# Patient Record
Sex: Male | Born: 1945 | Race: White | Hispanic: No | Marital: Married | Smoking: Former smoker
Health system: Southern US, Community
[De-identification: ages and names within clinical notes are randomized; demographics above are authoritative.]

## PROBLEM LIST (undated history)

## (undated) DIAGNOSIS — E785 Hyperlipidemia, unspecified: Secondary | ICD-10-CM

## (undated) DIAGNOSIS — I2510 Atherosclerotic heart disease of native coronary artery without angina pectoris: Secondary | ICD-10-CM

## (undated) DIAGNOSIS — I219 Acute myocardial infarction, unspecified: Secondary | ICD-10-CM

## (undated) DIAGNOSIS — M1990 Unspecified osteoarthritis, unspecified site: Secondary | ICD-10-CM

## (undated) DIAGNOSIS — R001 Bradycardia, unspecified: Secondary | ICD-10-CM

## (undated) DIAGNOSIS — C4491 Basal cell carcinoma of skin, unspecified: Secondary | ICD-10-CM

## (undated) DIAGNOSIS — I519 Heart disease, unspecified: Secondary | ICD-10-CM

## (undated) DIAGNOSIS — Z9889 Other specified postprocedural states: Secondary | ICD-10-CM

## (undated) DIAGNOSIS — L821 Other seborrheic keratosis: Secondary | ICD-10-CM

## (undated) DIAGNOSIS — R112 Nausea with vomiting, unspecified: Secondary | ICD-10-CM

## (undated) HISTORY — PX: DOPPLER ECHOCARDIOGRAPHY: SHX263

## (undated) HISTORY — DX: Atherosclerotic heart disease of native coronary artery without angina pectoris: I25.10

## (undated) HISTORY — PX: NM MYOVIEW LTD: HXRAD82

## (undated) HISTORY — DX: Bradycardia, unspecified: R00.1

## (undated) HISTORY — PX: KNEE ARTHROPLASTY: SHX992

## (undated) HISTORY — DX: Basal cell carcinoma of skin, unspecified: C44.91

## (undated) HISTORY — DX: Hyperlipidemia, unspecified: E78.5

## (undated) HISTORY — DX: Other seborrheic keratosis: L82.1

## (undated) HISTORY — DX: Heart disease, unspecified: I51.9

---

## 1991-03-10 HISTORY — PX: OTHER SURGICAL HISTORY: SHX169

## 1992-03-09 HISTORY — PX: GALLBLADDER SURGERY: SHX652

## 1998-05-23 ENCOUNTER — Ambulatory Visit (HOSPITAL_COMMUNITY): Admission: RE | Admit: 1998-05-23 | Discharge: 1998-05-23 | Payer: Self-pay | Admitting: Gastroenterology

## 2005-03-09 DIAGNOSIS — I219 Acute myocardial infarction, unspecified: Secondary | ICD-10-CM

## 2005-03-09 HISTORY — DX: Acute myocardial infarction, unspecified: I21.9

## 2005-06-11 ENCOUNTER — Inpatient Hospital Stay (HOSPITAL_COMMUNITY): Admission: EM | Admit: 2005-06-11 | Discharge: 2005-06-13 | Payer: Self-pay | Admitting: Emergency Medicine

## 2005-07-02 ENCOUNTER — Encounter (HOSPITAL_COMMUNITY): Admission: RE | Admit: 2005-07-02 | Discharge: 2005-09-30 | Payer: Self-pay | Admitting: Cardiology

## 2005-10-01 ENCOUNTER — Encounter (HOSPITAL_COMMUNITY): Admission: RE | Admit: 2005-10-01 | Discharge: 2005-11-17 | Payer: Self-pay | Admitting: Cardiology

## 2012-04-08 ENCOUNTER — Other Ambulatory Visit (HOSPITAL_COMMUNITY): Payer: Self-pay | Admitting: Cardiovascular Disease

## 2012-04-08 DIAGNOSIS — I729 Aneurysm of unspecified site: Secondary | ICD-10-CM

## 2012-04-08 DIAGNOSIS — I350 Nonrheumatic aortic (valve) stenosis: Secondary | ICD-10-CM

## 2012-04-29 ENCOUNTER — Ambulatory Visit (HOSPITAL_COMMUNITY)
Admission: RE | Admit: 2012-04-29 | Discharge: 2012-04-29 | Disposition: A | Discharge status: Home / Self Care | Payer: Medicare Other | Source: Ambulatory Visit | Attending: Cardiovascular Disease | Admitting: Cardiovascular Disease

## 2012-04-29 ENCOUNTER — Ambulatory Visit (HOSPITAL_COMMUNITY): Payer: Self-pay

## 2012-04-29 DIAGNOSIS — I729 Aneurysm of unspecified site: Secondary | ICD-10-CM

## 2012-04-29 DIAGNOSIS — I350 Nonrheumatic aortic (valve) stenosis: Secondary | ICD-10-CM

## 2012-04-29 DIAGNOSIS — I359 Nonrheumatic aortic valve disorder, unspecified: Secondary | ICD-10-CM | POA: Insufficient documentation

## 2012-04-29 NOTE — Progress Notes (Signed)
Aorta Duplex Completed. Rashelle Ireland D  

## 2012-04-29 NOTE — Progress Notes (Signed)
[facility]   2D echo completed 04/29/2012.   Cindy Ricketta Colantonio, RDCS  

## 2012-10-04 ENCOUNTER — Telehealth: Payer: Self-pay | Admitting: Cardiovascular Disease

## 2012-10-04 NOTE — Telephone Encounter (Signed)
Please mail patient a lab slip so her can have his labs drawn prior to his visit on 10/24/12 with Dr. Alanda Amass.

## 2012-10-04 NOTE — Telephone Encounter (Signed)
Returned call.  Pt informed lab(s) ordered and Lab slip mailed.  Pt verbalized understanding and agreed w/ plan.

## 2012-10-18 ENCOUNTER — Other Ambulatory Visit: Payer: Self-pay | Admitting: Cardiovascular Disease

## 2012-10-18 LAB — COMPREHENSIVE METABOLIC PANEL
ALT: 12 U/L (ref 0–53)
AST: 23 U/L (ref 0–37)
Albumin: 4 g/dL (ref 3.5–5.2)
Alkaline Phosphatase: 62 U/L (ref 39–117)
BUN: 18 mg/dL (ref 6–23)
CO2: 28 mEq/L (ref 19–32)
Calcium: 8.9 mg/dL (ref 8.4–10.5)
Chloride: 106 mEq/L (ref 96–112)
Creat: 1.28 mg/dL (ref 0.50–1.35)
Glucose, Bld: 106 mg/dL — ABNORMAL HIGH (ref 70–99)
Potassium: 4.7 mEq/L (ref 3.5–5.3)
Sodium: 142 mEq/L (ref 135–145)
Total Bilirubin: 0.6 mg/dL (ref 0.3–1.2)
Total Protein: 6.4 g/dL (ref 6.0–8.3)

## 2012-10-18 LAB — CBC
HCT: 44.2 % (ref 39.0–52.0)
Hemoglobin: 15.4 g/dL (ref 13.0–17.0)
MCH: 31.5 pg (ref 26.0–34.0)
MCHC: 34.8 g/dL (ref 30.0–36.0)
MCV: 90.4 fL (ref 78.0–100.0)
Platelets: 235 10*3/uL (ref 150–400)
RBC: 4.89 MIL/uL (ref 4.22–5.81)
RDW: 13.8 % (ref 11.5–15.5)
WBC: 7.4 10*3/uL (ref 4.0–10.5)

## 2012-10-18 LAB — LIPID PANEL
Cholesterol: 130 mg/dL (ref 0–200)
HDL: 37 mg/dL — ABNORMAL LOW (ref >39)
LDL Cholesterol: 69 mg/dL (ref 0–99)
Total CHOL/HDL Ratio: 3.5 Ratio
Triglycerides: 118 mg/dL (ref <150)
VLDL: 24 mg/dL (ref 0–40)

## 2012-10-28 ENCOUNTER — Encounter: Payer: Self-pay | Admitting: Cardiovascular Disease

## 2013-05-22 ENCOUNTER — Telehealth: Payer: Self-pay | Admitting: Cardiovascular Disease

## 2013-05-22 MED ORDER — ATORVASTATIN CALCIUM 10 MG PO TABS: 5.0000 mg | ORAL_TABLET | Freq: Every day | ORAL | Status: DC

## 2013-05-22 NOTE — Telephone Encounter (Signed)
Rx was sent to pharmacy electronically. Patient will call in April to set up appmt with another provider as [name] has retired

## 2013-05-22 NOTE — Telephone Encounter (Signed)
Pt of [name].  Needs refill on Atorvastatin. Pharmacy instructed patient to contact our office. [facility].

## 2013-10-13 ENCOUNTER — Telehealth: Payer: Self-pay | Admitting: Cardiology

## 2013-10-13 NOTE — Telephone Encounter (Signed)
Pt stated that he would like for JC to give him a call. He is a former [place] pt.   Thanks

## 2013-10-23 ENCOUNTER — Telehealth: Payer: Self-pay | Admitting: Cardiovascular Disease

## 2013-10-23 DIAGNOSIS — I351 Nonrheumatic aortic (valve) insufficiency: Secondary | ICD-10-CM

## 2013-10-23 NOTE — Telephone Encounter (Signed)
Pt called stating that he would like an order for some blood work put in so he can get that done before his appt on 10/7. Pleasen call  thanks

## 2013-10-23 NOTE — Telephone Encounter (Signed)
Returned call to patient spoke to wife she stated husband would like to have fasting lab work before appointment with [facility] 12/13/13.Lab orders mailed to patient.

## 2013-10-24 ENCOUNTER — Other Ambulatory Visit: Payer: Self-pay | Admitting: Cardiovascular Disease

## 2013-10-24 NOTE — Telephone Encounter (Signed)
Rx was sent to pharmacy electronically. 

## 2013-12-05 ENCOUNTER — Telehealth: Payer: Self-pay | Admitting: Cardiovascular Disease

## 2013-12-05 NOTE — Telephone Encounter (Signed)
Closed encounter °

## 2013-12-06 LAB — COMPREHENSIVE METABOLIC PANEL
ALT: 10 U/L (ref 0–53)
AST: 21 U/L (ref 0–37)
Albumin: 3.9 g/dL (ref 3.5–5.2)
Alkaline Phosphatase: 60 U/L (ref 39–117)
BILIRUBIN TOTAL: 0.6 mg/dL (ref 0.2–1.2)
BUN: 19 mg/dL (ref 6–23)
CALCIUM: 9 mg/dL (ref 8.4–10.5)
CO2: 27 mEq/L (ref 19–32)
Chloride: 107 mEq/L (ref 96–112)
Creat: 1.22 mg/dL (ref 0.50–1.35)
Glucose, Bld: 94 mg/dL (ref 70–99)
POTASSIUM: 4.5 meq/L (ref 3.5–5.3)
SODIUM: 142 meq/L (ref 135–145)
TOTAL PROTEIN: 6.3 g/dL (ref 6.0–8.3)

## 2013-12-06 LAB — CBC WITH DIFFERENTIAL/PLATELET
Basophils Absolute: 0.1 10*3/uL (ref 0.0–0.1)
Basophils Relative: 1 % (ref 0–1)
EOS PCT: 4 % (ref 0–5)
Eosinophils Absolute: 0.3 10*3/uL (ref 0.0–0.7)
HEMATOCRIT: 44.8 % (ref 39.0–52.0)
Hemoglobin: 15.1 g/dL (ref 13.0–17.0)
LYMPHS ABS: 1.4 10*3/uL (ref 0.7–4.0)
Lymphocytes Relative: 18 % (ref 12–46)
MCH: 31.3 pg (ref 26.0–34.0)
MCHC: 33.7 g/dL (ref 30.0–36.0)
MCV: 92.9 fL (ref 78.0–100.0)
MONO ABS: 0.8 10*3/uL (ref 0.1–1.0)
Monocytes Relative: 10 % (ref 3–12)
NEUTROS ABS: 5.4 10*3/uL (ref 1.7–7.7)
Neutrophils Relative %: 67 % (ref 43–77)
PLATELETS: 226 10*3/uL (ref 150–400)
RBC: 4.82 MIL/uL (ref 4.22–5.81)
RDW: 14.1 % (ref 11.5–15.5)
WBC: 8 10*3/uL (ref 4.0–10.5)

## 2013-12-06 LAB — LIPID PANEL
Cholesterol: 131 mg/dL (ref 0–200)
HDL: 42 mg/dL (ref >39)
LDL CALC: 65 mg/dL (ref 0–99)
Total CHOL/HDL Ratio: 3.1 Ratio
Triglycerides: 121 mg/dL (ref <150)
VLDL: 24 mg/dL (ref 0–40)

## 2013-12-06 LAB — TSH: TSH: 3.83 u[IU]/mL (ref 0.350–4.500)

## 2013-12-08 ENCOUNTER — Encounter: Payer: Self-pay | Admitting: *Deleted

## 2013-12-13 ENCOUNTER — Ambulatory Visit: Payer: Self-pay | Admitting: Cardiovascular Disease

## 2014-01-19 ENCOUNTER — Encounter: Payer: Self-pay | Admitting: Cardiovascular Disease

## 2014-01-19 ENCOUNTER — Ambulatory Visit (INDEPENDENT_AMBULATORY_CARE_PROVIDER_SITE_OTHER): Payer: Medicare Other | Admitting: Cardiovascular Disease

## 2014-01-19 VITALS — BP 106/70 | HR 48 | Ht 70.8 in | Wt 181.0 lb

## 2014-01-19 DIAGNOSIS — E785 Hyperlipidemia, unspecified: Secondary | ICD-10-CM

## 2014-01-19 DIAGNOSIS — I25810 Atherosclerosis of coronary artery bypass graft(s) without angina pectoris: Secondary | ICD-10-CM | POA: Insufficient documentation

## 2014-01-19 NOTE — Assessment & Plan Note (Signed)
On atorvastatin 10 mg with recent lipid profile performed 12/05/13 revealing a total cholesterol 131, LDL 65 and HDL of 42 which was excellent for secondary prevention. We will keep him on his current medication

## 2014-01-19 NOTE — Progress Notes (Signed)
01/19/2014 Marvin Ingram   May 02, 1945  [id number]  Primary Physician Marvin Seller, MD Primary Cardiologist: Marvin Harp MD Marvin Ingram   HPI:  History Marvin Ingram is a delightful 68-year-old fit-appearing married Caucasian male father of 3 children, grandfather, 5 grandchildren formerly a patient of Dr. Georgiann Ingram. I'm assuming his care. 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 He does have one son in the [employer] who lives in [place] , [place].his primary care physician is Dr. Kathryne Ingram. He has a history of coronary artery disease status post non-ST segment elevation myocardial infarction April 2007 with cardiac catheterization performed by Dr. Rollene Ingram 4/6/7 revealing high-grade mid and distal LAD disease which were intervened on with Taxus drug-eluting stents. His RCA and circumflex were free of significant disease and he had normal LV function. He smoked remotely and stopped in 1965. He has treated hyperlipidemia at goal for secondary prevention on low-dose atorvastatin. Otherwise, [facility] [employer] and denies chest pain or shortness of breath.   Current Outpatient Prescriptions  Medication Sig Dispense Refill  . aspirin 81 MG tablet Take 162 mg by mouth daily.     Marland Kitchen atorvastatin (LIPITOR) 10 MG tablet TAKE 1/2 TABLET BY MOUTH EVERY DAY (Patient taking differently: TAKE 1/2 TABLET BY MOUTH EVERY DAY   (5 mg)) 45 tablet 0  . Omega-3 Fatty Acids (FISH OIL) 1200 MG CAPS Take 2,400 mg by mouth daily.      No current facility-administered medications for this visit.    No Known Allergies  History   Social History  . Marital Status: Married    Spouse Name: N/A    Number of Children: N/A  . Years of Education: N/A   Occupational History  . Not on file.   Social History Main Topics  . Smoking status: Former Research scientist (life sciences)  . Smokeless tobacco: Not on file     Comment: smoked off and on during college  . Alcohol Use: Not on  file  . Drug Use: Not on file  . Sexual Activity: Not on file   Other Topics Concern  . Not on file   Social History Narrative     Review of Systems: General: negative for chills, fever, night sweats or weight changes.  Cardiovascular: negative for chest pain, dyspnea on exertion, edema, orthopnea, palpitations, paroxysmal nocturnal dyspnea or shortness of breath Dermatological: negative for rash Respiratory: negative for cough or wheezing Urologic: negative for hematuria Abdominal: negative for nausea, vomiting, diarrhea, bright red blood per rectum, melena, or hematemesis Neurologic: negative for visual changes, syncope, or dizziness All other systems reviewed and are otherwise negative except as noted above.    Blood pressure 106/70, pulse 48, height 5' 10.8" (1.798 m), weight 181 lb (82.101 kg).  General appearance: alert and no distress Neck: no adenopathy, no carotid bruit, no JVD, supple, symmetrical, trachea midline and thyroid not enlarged, symmetric, no tenderness/mass/nodules Lungs: clear to auscultation bilaterally Heart: regular rate and rhythm, S1, S2 normal, no murmur, click, rub or gallop Extremities: extremities normal, atraumatic, no cyanosis or edema  EKG normal sinus rhythm at 61 without ST or T-wave changes. I personally reviewed the EKG  ASSESSMENT AND PLAN:   CAD (coronary artery disease) of artery bypass graft History of CAD status post non-ST segment elevation myocardial infarction with chronic catheterization performed by Dr. Rollene Ingram 06/12/05 revealing high-grade mid and distal LAD disease both of which were intervened on with Taxus drug-eluting stents. His circumflex  and RCA were free of disease and his LV function was normal. His last Myoview stress test performed 08/27/11 was nonischemic. He denies chest pain or shortness of breath.  Hyperlipidemia On atorvastatin 10 mg with recent lipid profile performed 12/05/13 revealing a total cholesterol 131, LDL  65 and HDL of 42 which was excellent for secondary prevention. We will keep him on his current medication      Marvin Harp MD [facility], [facility] 01/19/2014 9:49 AM

## 2014-01-19 NOTE — Patient Instructions (Signed)
Your physician wants you to follow-up in: 1 year with Dr Berry. You will receive a reminder letter in the mail two months in advance. If you don't receive a letter, please call our office to schedule the follow-up appointment.  

## 2014-01-19 NOTE — Assessment & Plan Note (Signed)
History of CAD status post non-ST segment elevation myocardial infarction with chronic catheterization performed by [name] 06/12/05 revealing high-grade mid and distal LAD disease both of which were intervened on with Taxus drug-eluting stents. His circumflex and RCA were free of disease and his LV function was normal. His last Myoview stress test performed 08/27/11 was nonischemic. He denies chest pain or shortness of breath.

## 2014-02-28 ENCOUNTER — Other Ambulatory Visit: Payer: Self-pay | Admitting: Cardiovascular Disease

## 2014-02-28 NOTE — Telephone Encounter (Signed)
Rx(s) sent to pharmacy electronically.  

## 2014-03-09 HISTORY — PX: CATARACT EXTRACTION: SUR2

## 2014-11-14 ENCOUNTER — Other Ambulatory Visit: Payer: Self-pay | Admitting: Cardiovascular Disease

## 2014-11-14 ENCOUNTER — Telehealth: Payer: Self-pay | Admitting: Cardiovascular Disease

## 2014-11-14 DIAGNOSIS — Z79899 Other long term (current) drug therapy: Secondary | ICD-10-CM

## 2014-11-14 DIAGNOSIS — E785 Hyperlipidemia, unspecified: Secondary | ICD-10-CM

## 2014-11-14 NOTE — Telephone Encounter (Signed)
Is asking to have a lab order sent to him before his appt aon 01/22/15 at 7:45a,m w/ Dr. Gwenlyn Found   Thanks

## 2014-11-14 NOTE — Telephone Encounter (Signed)
Lipid and CMET ordered. Lab slips mailed to patient.

## 2014-11-14 NOTE — Telephone Encounter (Signed)
Rx request sent to pharmacy.  

## 2015-01-12 LAB — COMPREHENSIVE METABOLIC PANEL
ALT: 12 U/L (ref 9–46)
AST: 25 U/L (ref 10–35)
Albumin: 4.1 g/dL (ref 3.6–5.1)
Alkaline Phosphatase: 65 U/L (ref 40–115)
BILIRUBIN TOTAL: 0.5 mg/dL (ref 0.2–1.2)
BUN: 19 mg/dL (ref 7–25)
CALCIUM: 9.2 mg/dL (ref 8.6–10.3)
CO2: 29 mmol/L (ref 20–31)
CREATININE: 1.29 mg/dL — AB (ref 0.70–1.18)
Chloride: 104 mmol/L (ref 98–110)
GLUCOSE: 95 mg/dL (ref 65–99)
Potassium: 4.9 mmol/L (ref 3.5–5.3)
SODIUM: 141 mmol/L (ref 135–146)
Total Protein: 6.8 g/dL (ref 6.1–8.1)

## 2015-01-12 LAB — LIPID PANEL
CHOL/HDL RATIO: 3.3 ratio (ref <=5.0)
Cholesterol: 129 mg/dL (ref 125–200)
HDL: 39 mg/dL — AB (ref >=40)
LDL CALC: 68 mg/dL (ref <130)
TRIGLYCERIDES: 110 mg/dL (ref <150)
VLDL: 22 mg/dL (ref <30)

## 2015-01-22 ENCOUNTER — Ambulatory Visit (INDEPENDENT_AMBULATORY_CARE_PROVIDER_SITE_OTHER): Payer: Medicare Other | Admitting: Cardiovascular Disease

## 2015-01-22 ENCOUNTER — Encounter: Payer: Self-pay | Admitting: Cardiovascular Disease

## 2015-01-22 VITALS — BP 122/80 | HR 53 | Ht 69.0 in | Wt 184.5 lb

## 2015-01-22 DIAGNOSIS — E785 Hyperlipidemia, unspecified: Secondary | ICD-10-CM | POA: Diagnosis not present

## 2015-01-22 DIAGNOSIS — Z79899 Other long term (current) drug therapy: Secondary | ICD-10-CM

## 2015-01-22 NOTE — Progress Notes (Signed)
01/22/2015 Marvin Ingram   1945/03/29  [id number]  Primary Physician Woody Seller, MD Primary Cardiologist: Lorretta Harp MD Renae Gloss   HPI:  Marvin Ingram is a delightful 69 year old fit-appearing married Caucasian male father of 3 children, grandfather, 5 grandchildren formerly a patient of [name]. I last saw him in the office 01/19/14. 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 He does have one son in the [employer] who lives in [place] , [place].his primary care physician is [name]. He has a history of coronary artery disease status post non-ST segment elevation myocardial infarction April 2007 with cardiac catheterization performed by [name] 4/6/7 revealing high-grade mid and distal LAD disease which were intervened on with Taxus drug-eluting stents. His RCA and circumflex were free of significant disease and he had normal LV function. He smoked remotely and stopped in 1965. He has treated hyperlipidemia at goal for secondary prevention on low-dose atorvastatin. Otherwise, [facility] [employer] and denies chest pain or shortness of breath.   Current Outpatient Prescriptions  Medication Sig Dispense Refill  . aspirin 81 MG tablet Take 162 mg by mouth daily.     Marland Kitchen atorvastatin (LIPITOR) 10 MG tablet TAKE 1/2 TABLET BY MOUTH EVERY DAY 45 tablet 2  . neomycin-polymyxin b-dexamethasone (MAXITROL) 3.5-10000-0.1 OINT APPLY SMALL AMOUNT TO LOWER RIGHT EYELID AT BEDTIME FOR 2 WEEKS, START AFTER SURGERY  0  . Omega-3 Fatty Acids (FISH OIL) 1200 MG CAPS Take 2,400 mg by mouth daily.     Vladimir Faster Glycol-Propyl Glycol (SYSTANE) 0.4-0.3 % SOLN Apply 1 drop to eye 4 (four) times daily as needed (use 1-2 drops 3-4 times a day).     No current facility-administered medications for this visit.    No Known Allergies  Social History   Social History  . Marital Status: Married    Spouse Name: N/A  . Number of  Children: N/A  . Years of Education: N/A   Occupational History  . Not on file.   Social History Main Topics  . Smoking status: Former Research scientist (life sciences)  . Smokeless tobacco: Not on file     Comment: smoked off and on during college  . Alcohol Use: Not on file  . Drug Use: Not on file  . Sexual Activity: Not on file   Other Topics Concern  . Not on file   Social History Narrative     Review of Systems: General: negative for chills, fever, night sweats or weight changes.  Cardiovascular: negative for chest pain, dyspnea on exertion, edema, orthopnea, palpitations, paroxysmal nocturnal dyspnea or shortness of breath Dermatological: negative for rash Respiratory: negative for cough or wheezing Urologic: negative for hematuria Abdominal: negative for nausea, vomiting, diarrhea, bright red blood per rectum, melena, or hematemesis Neurologic: negative for visual changes, syncope, or dizziness All other systems reviewed and are otherwise negative except as noted above.    Blood pressure 122/80, pulse 53, height 5\' 9"  (1.753 m), weight 184 lb 8 oz (83.689 kg).  General appearance: alert and no distress Neck: no adenopathy, no carotid bruit, no JVD, supple, symmetrical, trachea midline and thyroid not enlarged, symmetric, no tenderness/mass/nodules Lungs: clear to auscultation bilaterally Heart: regular rate and rhythm, S1, S2 normal, no murmur, click, rub or gallop Extremities: extremities normal, atraumatic, no cyanosis or edema  EKG sinus bradycardia 53 without ST or T-wave changes. I personally reviewed this EKG  ASSESSMENT AND PLAN:   Hyperlipidemia History  of hyperlipidemia with recent lipid profile performed 01/11/15 revealing total cholesterol 129, LDL 68 an HDL of 39. He is on atorvastatin 10 mg a day  CAD (coronary artery disease) of artery bypass graft History of CAD status post non-STEMI April 2007 with cath revealing a high-grade mid and distal LAD lesions which were stented  using Taxus drug eluting stents. His RCA and circumflex were free of significant disease and he had normal LV function. He denies chest pain or shortness of breath and exercises frequently.      Lorretta Harp MD FACP,FACC,FAHA, [facility] 01/22/2015 8:08 AM

## 2015-01-22 NOTE — Patient Instructions (Signed)

## 2015-01-22 NOTE — Assessment & Plan Note (Signed)
History of hyperlipidemia with recent lipid profile performed 01/11/15 revealing total cholesterol 129, LDL 68 an HDL of 39. He is on atorvastatin 10 mg a day

## 2015-01-22 NOTE — Assessment & Plan Note (Signed)
History of CAD status post non-STEMI April 2007 with cath revealing a high-grade mid and distal LAD lesions which were stented using Taxus drug eluting stents. His RCA and circumflex were free of significant disease and he had normal LV function. He denies chest pain or shortness of breath and exercises frequently.

## 2015-07-25 DIAGNOSIS — H40011 Open angle with borderline findings, low risk, right eye: Secondary | ICD-10-CM | POA: Diagnosis not present

## 2015-07-25 DIAGNOSIS — H2512 Age-related nuclear cataract, left eye: Secondary | ICD-10-CM | POA: Diagnosis not present

## 2015-07-25 DIAGNOSIS — H40012 Open angle with borderline findings, low risk, left eye: Secondary | ICD-10-CM | POA: Diagnosis not present

## 2015-08-04 ENCOUNTER — Other Ambulatory Visit: Payer: Self-pay | Admitting: Cardiovascular Disease

## 2015-08-06 NOTE — Telephone Encounter (Signed)
Rx(s) sent to pharmacy electronically.  

## 2015-11-21 DIAGNOSIS — D2272 Melanocytic nevi of left lower limb, including hip: Secondary | ICD-10-CM | POA: Diagnosis not present

## 2015-11-21 DIAGNOSIS — D0362 Melanoma in situ of left upper limb, including shoulder: Secondary | ICD-10-CM | POA: Diagnosis not present

## 2015-11-21 DIAGNOSIS — Z85828 Personal history of other malignant neoplasm of skin: Secondary | ICD-10-CM | POA: Diagnosis not present

## 2015-11-21 DIAGNOSIS — L565 Disseminated superficial actinic porokeratosis (DSAP): Secondary | ICD-10-CM | POA: Diagnosis not present

## 2015-11-21 DIAGNOSIS — L821 Other seborrheic keratosis: Secondary | ICD-10-CM | POA: Diagnosis not present

## 2015-11-21 DIAGNOSIS — D1801 Hemangioma of skin and subcutaneous tissue: Secondary | ICD-10-CM | POA: Diagnosis not present

## 2015-11-21 DIAGNOSIS — C44719 Basal cell carcinoma of skin of left lower limb, including hip: Secondary | ICD-10-CM | POA: Diagnosis not present

## 2015-11-21 DIAGNOSIS — L80 Vitiligo: Secondary | ICD-10-CM | POA: Diagnosis not present

## 2015-12-09 DIAGNOSIS — Z23 Encounter for immunization: Secondary | ICD-10-CM | POA: Diagnosis not present

## 2015-12-16 ENCOUNTER — Telehealth: Payer: Self-pay | Admitting: Cardiovascular Disease

## 2015-12-16 DIAGNOSIS — E785 Hyperlipidemia, unspecified: Secondary | ICD-10-CM

## 2015-12-16 NOTE — Telephone Encounter (Signed)
New message    Pt verbalized that he wants a order for labs sent to his home  Also he wants the rn to call him when she sends it

## 2015-12-16 NOTE — Telephone Encounter (Signed)
LM for pt to call back-no answer

## 2015-12-17 DIAGNOSIS — D0362 Melanoma in situ of left upper limb, including shoulder: Secondary | ICD-10-CM | POA: Diagnosis not present

## 2015-12-17 DIAGNOSIS — Z85828 Personal history of other malignant neoplasm of skin: Secondary | ICD-10-CM | POA: Diagnosis not present

## 2015-12-17 DIAGNOSIS — D2272 Melanocytic nevi of left lower limb, including hip: Secondary | ICD-10-CM | POA: Diagnosis not present

## 2015-12-17 DIAGNOSIS — C44719 Basal cell carcinoma of skin of left lower limb, including hip: Secondary | ICD-10-CM | POA: Diagnosis not present

## 2015-12-17 DIAGNOSIS — L821 Other seborrheic keratosis: Secondary | ICD-10-CM | POA: Diagnosis not present

## 2015-12-18 NOTE — Telephone Encounter (Signed)
Lab entered and mailed

## 2015-12-31 DIAGNOSIS — Z4802 Encounter for removal of sutures: Secondary | ICD-10-CM | POA: Diagnosis not present

## 2016-01-14 DIAGNOSIS — E785 Hyperlipidemia, unspecified: Secondary | ICD-10-CM | POA: Diagnosis not present

## 2016-01-15 LAB — COMPREHENSIVE METABOLIC PANEL
ALBUMIN: 4.3 g/dL (ref 3.6–5.1)
ALK PHOS: 62 U/L (ref 40–115)
ALT: 12 U/L (ref 9–46)
AST: 22 U/L (ref 10–35)
BILIRUBIN TOTAL: 0.6 mg/dL (ref 0.2–1.2)
BUN: 18 mg/dL (ref 7–25)
CALCIUM: 9.1 mg/dL (ref 8.6–10.3)
CO2: 25 mmol/L (ref 20–31)
CREATININE: 1.31 mg/dL — AB (ref 0.70–1.18)
Chloride: 105 mmol/L (ref 98–110)
Glucose, Bld: 102 mg/dL — ABNORMAL HIGH (ref 65–99)
Potassium: 4.2 mmol/L (ref 3.5–5.3)
SODIUM: 141 mmol/L (ref 135–146)
TOTAL PROTEIN: 7.2 g/dL (ref 6.1–8.1)

## 2016-01-15 LAB — LIPID PANEL
Cholesterol: 144 mg/dL (ref <200)
HDL: 42 mg/dL (ref >40)
LDL CALC: 78 mg/dL
TRIGLYCERIDES: 119 mg/dL (ref <150)
Total CHOL/HDL Ratio: 3.4 Ratio (ref <5.0)
VLDL: 24 mg/dL (ref <30)

## 2016-01-22 ENCOUNTER — Ambulatory Visit: Payer: Self-pay | Admitting: Cardiovascular Disease

## 2016-01-23 ENCOUNTER — Ambulatory Visit (INDEPENDENT_AMBULATORY_CARE_PROVIDER_SITE_OTHER): Payer: PPO | Admitting: Cardiovascular Disease

## 2016-01-23 ENCOUNTER — Encounter: Payer: Self-pay | Admitting: Cardiovascular Disease

## 2016-01-23 VITALS — BP 140/88 | HR 56 | Ht 69.0 in | Wt 184.8 lb

## 2016-01-23 DIAGNOSIS — R002 Palpitations: Secondary | ICD-10-CM | POA: Diagnosis not present

## 2016-01-23 DIAGNOSIS — I25810 Atherosclerosis of coronary artery bypass graft(s) without angina pectoris: Secondary | ICD-10-CM

## 2016-01-23 NOTE — Patient Instructions (Signed)
Medication Instructions: Your physician recommends that you continue on your current medications as directed. Please refer to the Current Medication list given to you today.   Follow-Up: Your physician recommends that you schedule a follow-up appointment in: 3 months with Dr. Berry.  If you need a refill on your cardiac medications before your next appointment, please call your pharmacy.  

## 2016-01-23 NOTE — Assessment & Plan Note (Signed)
History of coronary artery disease status post non-STEMI April 2007. Catheterization performed by [name] 4/6/7 revealed high-grade mid and distal LAD disease which were intervened on with Taxus drug-eluting stents. The RCA has a complex were free of significant disease and he had normal LV function. He denies chest pain or shortness of breath.

## 2016-01-23 NOTE — Progress Notes (Signed)
01/23/2016 Marvin Ingram   12-17-1945  [id number]  Primary Physician Woody Seller, MD Primary Cardiologist: Lorretta Harp MD Renae Gloss  HPI:  Marvin Ingram is a delightful 70 year old fit-appearing married Caucasian male father of 3 children, grandfather, 5 grandchildren formerly a patient of [name]. I last saw him in the office 01/22/15. 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. He has a history of coronary artery disease status post non-ST segment elevation myocardial infarction April 2007 with cardiac catheterization performed by [name] 4/6/7 revealing high-grade mid and distal LAD disease which were intervened on with Taxus drug-eluting stents. His RCA and circumflex were free of significant disease and he had normal LV function. He smoked remotely and stopped in 1965. He has treated hyperlipidemia at goal for secondary prevention on low-dose atorvastatin. His most recent lipid profile performed 01/14/16 revealed a total cholesterol 144, LDL 78 and HDL of 42. Otherwise, [facility] [employer] and denies chest pain or shortness of breath. He has noticed new onset of palpitations occurring on a daily basis over the last several months. Does admit to drinking caffeinated tea.   Current Outpatient Prescriptions  Medication Sig Dispense Refill  . aspirin 81 MG tablet Take 162 mg by mouth daily.     Marland Kitchen atorvastatin (LIPITOR) 10 MG tablet TAKE 1/2 TABLET BY MOUTH EVERY DAY 45 tablet 1  . Omega-3 Fatty Acids (FISH OIL) 1200 MG CAPS Take 1,200 mg by mouth daily.     Vladimir Faster Glycol-Propyl Glycol (SYSTANE) 0.4-0.3 % SOLN Apply 1 drop to eye 4 (four) times daily as needed (use 1-2 drops 3-4 times a day).     No current facility-administered medications for this visit.     No Known Allergies  Social History    Social History  . Marital status: Married    Spouse name: N/A  . Number of children: N/A  . Years of education: N/A   Occupational History  . Not on file.   Social History Main Topics  . Smoking status: Former Research scientist (life sciences)  . Smokeless tobacco: Not on file     Comment: smoked off and on during college  . Alcohol use Not on file  . Drug use: Unknown  . Sexual activity: Not on file   Other Topics Concern  . Not on file   Social History Narrative  . No narrative on file     Review of Systems: General: negative for chills, fever, night sweats or weight changes.  Cardiovascular: negative for chest pain, dyspnea on exertion, edema, orthopnea, palpitations, paroxysmal nocturnal dyspnea or shortness of breath Dermatological: negative for rash Respiratory: negative for cough or wheezing Urologic: negative for hematuria Abdominal: negative for nausea, vomiting, diarrhea, bright red blood per rectum, melena, or hematemesis Neurologic: negative for visual changes, syncope, or dizziness All other systems reviewed and are otherwise negative except as noted above.    Blood pressure 140/88, pulse (!) 56, height 5\' 9"  (1.753 m), weight 184 lb 12.8 oz (83.8 kg), SpO2 99 %.  General appearance: alert and no distress Neck: no adenopathy, no carotid bruit, no JVD, supple, symmetrical, trachea midline and thyroid not enlarged, symmetric, no tenderness/mass/nodules Lungs: clear to auscultation bilaterally Heart: regular rate and rhythm, S1, S2 normal, no murmur, click, rub or gallop Extremities: extremities normal, atraumatic,  no cyanosis or edema  EKG sinus bradycardia 56 without ST or T-wave changes. I personally reviewed this EKG  ASSESSMENT AND PLAN:   CAD (coronary artery disease) of artery bypass graft History of coronary artery disease status post non-STEMI April 2007. Catheterization performed by [name] 4/6/7 revealed high-grade mid and distal LAD disease which were intervened  on with Taxus drug-eluting stents. The RCA has a complex were free of significant disease and he had normal LV function. He denies chest pain or shortness of breath.  Hyperlipidemia History of hyperlipidemia on statin therapy with recent lipid profile performed 01/14/16 revealed a total cholesterol of 44, LDL 78 and HDL of 42.  Palpitations Marvin. Ingram has complained of palpitations or several months occurring on a daily basis. He does drink caffeinated tea which I've asked him to discontinue. I will see him back in 3 months to reevaluate. At that time, if he continues to have palpitations we will get a 2 week event monitor.      Lorretta Harp MD FACP,FACC,FAHA, [facility] 01/23/2016 11:01 AM

## 2016-01-23 NOTE — Assessment & Plan Note (Signed)
History of hyperlipidemia on statin therapy with recent lipid profile performed 01/14/16 revealed a total cholesterol of 44, LDL 78 and HDL of 42.

## 2016-01-23 NOTE — Assessment & Plan Note (Signed)
Marvin Ingram has complained of palpitations or several months occurring on a daily basis. He does drink caffeinated tea which I've asked him to discontinue. I will see him back in 3 months to reevaluate. At that time, if he continues to have palpitations we will get a 2 week event monitor.

## 2016-04-02 DIAGNOSIS — M1731 Unilateral post-traumatic osteoarthritis, right knee: Secondary | ICD-10-CM | POA: Diagnosis not present

## 2016-04-02 DIAGNOSIS — M109 Gout, unspecified: Secondary | ICD-10-CM | POA: Insufficient documentation

## 2016-04-02 DIAGNOSIS — M179 Osteoarthritis of knee, unspecified: Secondary | ICD-10-CM | POA: Diagnosis not present

## 2016-04-24 ENCOUNTER — Ambulatory Visit (INDEPENDENT_AMBULATORY_CARE_PROVIDER_SITE_OTHER): Payer: PPO | Admitting: Cardiovascular Disease

## 2016-04-24 ENCOUNTER — Ambulatory Visit: Payer: PPO | Admitting: Cardiovascular Disease

## 2016-04-24 ENCOUNTER — Encounter: Payer: Self-pay | Admitting: Cardiovascular Disease

## 2016-04-24 DIAGNOSIS — E7800 Pure hypercholesterolemia, unspecified: Secondary | ICD-10-CM

## 2016-04-24 DIAGNOSIS — I25700 Atherosclerosis of coronary artery bypass graft(s), unspecified, with unstable angina pectoris: Secondary | ICD-10-CM

## 2016-04-24 DIAGNOSIS — R002 Palpitations: Secondary | ICD-10-CM | POA: Diagnosis not present

## 2016-04-24 NOTE — Assessment & Plan Note (Signed)
History of hyperlipidemia on statin therapy with recent lipid profile performed 01/14/16 related to cholesterol of 144, LDL 78 and HDL of 42.

## 2016-04-24 NOTE — Patient Instructions (Signed)

## 2016-04-24 NOTE — Progress Notes (Signed)
04/24/2016 Marvin Ingram   01-Sep-1945  [id number]  Primary Physician Marvin Seller, MD Primary Cardiologist: Marvin Harp MD Marvin Ingram  HPI:  Marvin Ingram is a delightful 71 year old fit-appearing married Caucasian male father of 3 children, grandfather, 5 grandchildren formerly a patient of Marvin Ingram. I last saw him in the office 01/23/16. He is accompanied by his wife p.m. today. 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 He has a history of coronary artery disease status post non-ST segment elevation myocardial infarction April 2007 with cardiac catheterization performed by Marvin Ingram 4/6/7 revealing high-grade mid and distal LAD disease which were intervened on with Taxus drug-eluting stents. His RCA and circumflex were free of significant disease and he had normal LV function. He smoked remotely and stopped in 1965. He has treated hyperlipidemia at goal for secondary prevention on low-dose atorvastatin. His most recent lipid profile performed 01/14/16 revealed a total cholesterol 144, LDL 78 and HDL of 42. Otherwise, [facility] [employer] and denies chest pain or shortness of breath. He had noticed new onset of palpitations occurring on a daily basis over the last several months prior to his last office visit. He  Does admit to drinking caffeinated tea which she has since stopped and his palpitations have resolved. His only real complaint is pain in his right knee and he is scheduled to see an orthopedic surgeon to further evaluate this.   Current Outpatient Prescriptions  Medication Sig Dispense Refill  . aspirin 81 MG tablet Take 162 mg by mouth daily.     Marland Kitchen atorvastatin (LIPITOR) 10 MG tablet TAKE 1/2 TABLET BY MOUTH EVERY DAY 45 tablet 1  . Omega-3 Fatty Acids (FISH OIL) 1200 MG CAPS Take 1,200 mg by mouth  daily.     Vladimir Faster Glycol-Propyl Glycol (SYSTANE) 0.4-0.3 % SOLN Apply 1 drop to eye 4 (four) times daily as needed (use 1-2 drops 3-4 times a day).     No current facility-administered medications for this visit.     No Known Allergies  Social History   Social History  . Marital status: Married    Spouse name: N/A  . Number of children: N/A  . Years of education: N/A   Occupational History  . Not on file.   Social History Main Topics  . Smoking status: Former Research scientist (life sciences)  . Smokeless tobacco: Never Used     Comment: smoked off and on during college  . Alcohol use Not on file  . Drug use: Unknown  . Sexual activity: Not on file   Other Topics Concern  . Not on file   Social History Narrative  . No narrative on file     Review of Systems: General: negative for chills, fever, night sweats or weight changes.  Cardiovascular: negative for chest pain, dyspnea on exertion, edema, orthopnea, palpitations, paroxysmal nocturnal dyspnea or shortness of breath Dermatological: negative for rash Respiratory: negative for cough or wheezing Urologic: negative for hematuria Abdominal: negative for nausea, vomiting, diarrhea, bright red blood per rectum, melena, or hematemesis Neurologic: negative for visual changes, syncope, or dizziness All other systems reviewed and are otherwise negative except as noted above.    Blood pressure 130/82, pulse 64, height 5\' 9"  (1.753 m), weight 184 lb (83.5 kg).  General appearance: alert and no distress  Neck: no adenopathy, no carotid bruit, no JVD, supple, symmetrical, trachea midline and thyroid not enlarged, symmetric, no tenderness/mass/nodules Lungs: clear to auscultation bilaterally Heart: regular rate and rhythm, S1, S2 normal, no murmur, click, rub or gallop Extremities: extremities normal, atraumatic, no cyanosis or edema  EKG not performed today  ASSESSMENT AND PLAN:   CAD (coronary artery disease) of artery bypass graft History  of CAD status post non-STEMI April 2007 with catheterization performed by Marvin Ingram 4/6/7 revealing high-grade mid and distal LAD stenosis which was stented with Taxus drug-eluting stents. His RCA and circumflex were free of significant disease season he had normal LV function. He denies chest pain or shortness of breath.  Hyperlipidemia History of hyperlipidemia on statin therapy with recent lipid profile performed 01/14/16 related to cholesterol of 144, LDL 78 and HDL of 42.  Palpitations History of palpitations which have resolved since stopping caffeinated tea since I last saw him.      Marvin Harp MD FACP,FACC,FAHA, [facility] 04/24/2016 2:39 PM

## 2016-04-24 NOTE — Assessment & Plan Note (Signed)
History of CAD status post non-STEMI April 2007 with catheterization performed by [name] 4/6/7 revealing high-grade mid and distal LAD stenosis which was stented with Taxus drug-eluting stents. His RCA and circumflex were free of significant disease season he had normal LV function. He denies chest pain or shortness of breath.

## 2016-04-24 NOTE — Assessment & Plan Note (Signed)
History of palpitations which have resolved since stopping caffeinated tea since I last saw him.

## 2016-04-26 ENCOUNTER — Other Ambulatory Visit: Payer: Self-pay | Admitting: Cardiovascular Disease

## 2016-04-28 ENCOUNTER — Telehealth: Payer: Self-pay | Admitting: Cardiovascular Disease

## 2016-04-28 DIAGNOSIS — M1711 Unilateral primary osteoarthritis, right knee: Secondary | ICD-10-CM | POA: Diagnosis not present

## 2016-04-28 NOTE — Telephone Encounter (Signed)
Patient came in today and dropped off a surgical clearance form for Dr. Gwenlyn Found to sign for an upcoming surgery.  I have given it to [employer].

## 2016-04-30 ENCOUNTER — Telehealth: Payer: Self-pay | Admitting: Cardiovascular Disease

## 2016-04-30 NOTE — Telephone Encounter (Signed)
Requesting surgical clearance:  1. Type of surgery: Rt Total Knee Replacement  2. Surgeon:  Not specified  3.Surgical Date:  pending  4. Medications that need to be held: ASA 81--7 days prior    5. CAD: Yes  6. I will defer to:  Dr. Pearla Dubonnet Information:   [facility] Phone: [phone] Fax:  [phone]

## 2016-05-01 ENCOUNTER — Other Ambulatory Visit: Payer: Self-pay | Admitting: Cardiovascular Disease

## 2016-05-01 DIAGNOSIS — Z139 Encounter for screening, unspecified: Secondary | ICD-10-CM

## 2016-05-01 NOTE — Telephone Encounter (Signed)
Message sent to schedule test.

## 2016-05-01 NOTE — Telephone Encounter (Signed)
Needs pharmacologic Myoview stress test to risk stratify him prior to his total knee replacement.

## 2016-05-19 ENCOUNTER — Telehealth: Payer: Self-pay | Admitting: Cardiovascular Disease

## 2016-05-19 NOTE — Telephone Encounter (Signed)
Received call back from patient-patient wondering if he needs stress test prior to surgery.  Per chart review-myoview was recommended by Dr. Gwenlyn Found, order was placed and sent to scheduling on 2/23.    Test not scheduled at this time.  Patient aware test is needed and scheduling will reach out to him to get this set up.    Patient request we call him on his cell:  [phone]  Sent to scheduling again.

## 2016-05-19 NOTE — Telephone Encounter (Signed)
Pt said his surgeon had not received his clearance that he dropped off here at the office. Please call,he said he needs to talk to you about some other matters also.

## 2016-05-19 NOTE — Telephone Encounter (Signed)
Called the patient to schedule lexiscan myoview and scheduled with patients wife for next week.  Gave instructions for lexiscan to the wife and let her know a nurse would call a few days before the test to go over any medications to hold before the test.

## 2016-05-19 NOTE — Telephone Encounter (Signed)
Attempt to return call x 2-busy signal.

## 2016-05-20 NOTE — Telephone Encounter (Signed)
Myoview stress test scheduled for 05/26/16.

## 2016-05-21 ENCOUNTER — Telehealth (HOSPITAL_COMMUNITY): Payer: Self-pay

## 2016-05-21 NOTE — Telephone Encounter (Signed)
Encounter complete. 

## 2016-05-26 ENCOUNTER — Ambulatory Visit (HOSPITAL_COMMUNITY)
Admission: RE | Admit: 2016-05-26 | Discharge: 2016-05-26 | Disposition: A | Discharge status: Home / Self Care | Payer: PPO | Source: Ambulatory Visit | Attending: Cardiovascular Disease | Admitting: Cardiovascular Disease

## 2016-05-26 DIAGNOSIS — Z01810 Encounter for preprocedural cardiovascular examination: Secondary | ICD-10-CM

## 2016-05-26 DIAGNOSIS — Z139 Encounter for screening, unspecified: Secondary | ICD-10-CM | POA: Diagnosis not present

## 2016-05-26 DIAGNOSIS — Z136 Encounter for screening for cardiovascular disorders: Secondary | ICD-10-CM | POA: Insufficient documentation

## 2016-05-26 LAB — MYOCARDIAL PERFUSION IMAGING
CHL CUP NUCLEAR SDS: 1
CHL CUP NUCLEAR SRS: 0
CHL CUP NUCLEAR SSS: 1
LVDIAVOL: 98 mL (ref 62–150)
LVSYSVOL: 42 mL
NUC STRESS TID: 0.95
Peak HR: 86 {beats}/min
Rest HR: 53 {beats}/min

## 2016-05-26 MED ORDER — TECHNETIUM TC 99M TETROFOSMIN IV KIT
30.9000 | PACK | Freq: Once | INTRAVENOUS | Status: AC | PRN
  Administered 2016-05-26: 30.9 via INTRAVENOUS
  Filled 2016-05-26: qty 31

## 2016-05-26 MED ORDER — TECHNETIUM TC 99M TETROFOSMIN IV KIT
10.6000 | PACK | Freq: Once | INTRAVENOUS | Status: AC | PRN
  Administered 2016-05-26: 10.6 via INTRAVENOUS
  Filled 2016-05-26: qty 11

## 2016-05-26 MED ORDER — REGADENOSON 0.4 MG/5ML IV SOLN
0.4000 mg | Freq: Once | INTRAVENOUS | Status: AC
  Administered 2016-05-26: 0.4 mg via INTRAVENOUS

## 2016-05-27 ENCOUNTER — Telehealth: Payer: Self-pay | Admitting: Cardiovascular Disease

## 2016-05-27 NOTE — Telephone Encounter (Signed)
Myocardial Perfusion Imaging  Order: [id number]  Status:  Final result Visible to patient:  No (Not Released) Dx:  Risk and functional assessment  Notes recorded by Lorretta Harp, MD on 05/26/2016 at 4:30 PM EDT Essentially normal study. Repeat when clinically indicated.     Is pt cleared for surgery for normal stress test?

## 2016-05-27 NOTE — Telephone Encounter (Signed)
Myocardial Perfusion Imaging  Order: [id number]  Status:  Final result Visible to patient:  No (Not Released) Dx:  Risk and functional assessment  Notes recorded by Therisa Doyne on 05/27/2016 at 5:13 PM EDT Results given to pt. Pt verbalized understanding. Sent message to Dr. Gwenlyn Found to determine if pt cleared for his knee replacement. ------  Notes recorded by Lorretta Harp, MD on 05/26/2016 at 4:30 PM EDT Essentially normal study. Repeat when clinically indicated.

## 2016-05-28 NOTE — Telephone Encounter (Signed)
Cleared for surgery at low risk given low risk MV

## 2016-05-28 NOTE — Telephone Encounter (Signed)
Clearance routed to number provided via EPIC. 

## 2016-06-04 ENCOUNTER — Other Ambulatory Visit: Payer: Self-pay | Admitting: Orthopedic Surgery

## 2016-06-16 DIAGNOSIS — D2261 Melanocytic nevi of right upper limb, including shoulder: Secondary | ICD-10-CM | POA: Diagnosis not present

## 2016-06-16 DIAGNOSIS — L821 Other seborrheic keratosis: Secondary | ICD-10-CM | POA: Diagnosis not present

## 2016-06-16 DIAGNOSIS — C4441 Basal cell carcinoma of skin of scalp and neck: Secondary | ICD-10-CM | POA: Diagnosis not present

## 2016-06-16 DIAGNOSIS — D225 Melanocytic nevi of trunk: Secondary | ICD-10-CM | POA: Diagnosis not present

## 2016-06-16 DIAGNOSIS — Z85828 Personal history of other malignant neoplasm of skin: Secondary | ICD-10-CM | POA: Diagnosis not present

## 2016-06-16 DIAGNOSIS — D1801 Hemangioma of skin and subcutaneous tissue: Secondary | ICD-10-CM | POA: Diagnosis not present

## 2016-06-16 DIAGNOSIS — C44619 Basal cell carcinoma of skin of left upper limb, including shoulder: Secondary | ICD-10-CM | POA: Diagnosis not present

## 2016-06-16 DIAGNOSIS — Z85820 Personal history of malignant melanoma of skin: Secondary | ICD-10-CM | POA: Diagnosis not present

## 2016-06-16 DIAGNOSIS — L570 Actinic keratosis: Secondary | ICD-10-CM | POA: Diagnosis not present

## 2016-06-17 NOTE — Pre-Procedure Instructions (Addendum)
Marvin Ingram  06/17/2016      [id number] - [place], [place] - [address]. AT [address] [place] [address] Phone: [phone] Fax: [phone]    Your procedure is scheduled on Mon. Apr 23 at 815 AM   Report to [place] at 615 AM.  Call this number if you have problems the morning of surgery:  [phone]   Remember:  Do not eat food or drink liquids after midnight.  Take these medicines the morning of surgery with A SIP OF WATER (none).  Take all other medications as prescribed except 7 days prior to surgery STOP taking any Aspirin, Aleve, Naproxen, Ibuprofen, Motrin, Advil, Goody's, BC's, all herbal medications, fish oil, and all vitamins   Do not wear jewelry, make-up or nail polish.  Do not wear lotions, powders, or perfumes, or deoderant.  Do not shave 48 hours prior to surgery.  Men may shave face and neck.  Do not bring valuables to the hospital.  [facility] is not responsible for any belongings or valuables.  Contacts, dentures or bridgework may not be worn into surgery.  Leave your suitcase in the car.  After surgery it may be brought to your room.  For patients admitted to the hospital, discharge time will be determined by your treatment team.  Patients discharged the day of surgery will not be allowed to drive home.    Special instructions:   [place]- Preparing For Surgery  Before surgery, you can play an important role. Because skin is not sterile, your skin needs to be as free of germs as possible. You can reduce the number of germs on your skin by washing with CHG (chlorahexidine gluconate) Soap before surgery.  CHG is an antiseptic cleaner which kills germs and bonds with the skin to continue killing germs even after washing.  Please do not use if you have an allergy to CHG or antibacterial soaps. If your skin becomes reddened/irritated stop using the CHG.  Do not  shave (including legs and underarms) for at least 48 hours prior to first CHG shower. It is OK to shave your face.  Please follow these instructions carefully.   1. Shower the NIGHT BEFORE SURGERY and the MORNING OF SURGERY with CHG.   2. If you chose to wash your hair, wash your hair first as usual with your normal shampoo.  3. After you shampoo, rinse your hair and body thoroughly to remove the shampoo.  4. Use CHG as you would any other liquid soap. You can apply CHG directly to the skin and wash gently with a scrungie or a clean washcloth.   5. Apply the CHG Soap to your body ONLY FROM THE NECK DOWN.  Do not use on open wounds or open sores. Avoid contact with your eyes, ears, mouth and genitals (private parts). Wash genitals (private parts) with your normal soap.  6. Wash thoroughly, paying special attention to the area where your surgery will be performed.  7. Thoroughly rinse your body with warm water from the neck down.  8. DO NOT shower/wash with your normal soap after using and rinsing off the CHG Soap.  9. Pat yourself dry with a CLEAN TOWEL.   10. Wear CLEAN PAJAMAS   11. Place CLEAN SHEETS on your bed the night of your first shower and DO NOT SLEEP WITH PETS.   Day of Surgery: Do not apply any deodorants/lotions. Please wear clean  clothes to the hospital/surgery center.     Please read over the following fact sheets that you were given. Pain Booklet, Coughing and Deep Breathing, MRSA Information and Surgical Site Infection Prevention

## 2016-06-18 ENCOUNTER — Encounter (HOSPITAL_COMMUNITY)
Admission: RE | Admit: 2016-06-18 | Discharge: 2016-06-18 | Disposition: A | Discharge status: Home / Self Care | Payer: PPO | Source: Ambulatory Visit | Attending: Orthopedic Surgery | Admitting: Orthopedic Surgery

## 2016-06-18 ENCOUNTER — Encounter (HOSPITAL_COMMUNITY): Payer: Self-pay

## 2016-06-18 DIAGNOSIS — M1711 Unilateral primary osteoarthritis, right knee: Secondary | ICD-10-CM | POA: Diagnosis not present

## 2016-06-18 DIAGNOSIS — Z01812 Encounter for preprocedural laboratory examination: Secondary | ICD-10-CM | POA: Insufficient documentation

## 2016-06-18 HISTORY — DX: Unspecified osteoarthritis, unspecified site: M19.90

## 2016-06-18 HISTORY — DX: Nausea with vomiting, unspecified: R11.2

## 2016-06-18 HISTORY — DX: Acute myocardial infarction, unspecified: I21.9

## 2016-06-18 HISTORY — DX: Other specified postprocedural states: Z98.890

## 2016-06-18 LAB — SURGICAL PCR SCREEN
MRSA, PCR: NEGATIVE
STAPHYLOCOCCUS AUREUS: NEGATIVE

## 2016-06-18 LAB — CBC WITH DIFFERENTIAL/PLATELET
Basophils Absolute: 0 10*3/uL (ref 0.0–0.1)
Basophils Relative: 0 %
EOS ABS: 0.1 10*3/uL (ref 0.0–0.7)
Eosinophils Relative: 1 %
HCT: 38.1 % — ABNORMAL LOW (ref 39.0–52.0)
HEMOGLOBIN: 13.3 g/dL (ref 13.0–17.0)
Lymphocytes Relative: 19 %
Lymphs Abs: 1.1 10*3/uL (ref 0.7–4.0)
MCH: 31.9 pg (ref 26.0–34.0)
MCHC: 34.9 g/dL (ref 30.0–36.0)
MCV: 91.4 fL (ref 78.0–100.0)
Monocytes Absolute: 0.4 10*3/uL (ref 0.1–1.0)
Monocytes Relative: 7 %
NEUTROS PCT: 73 %
Neutro Abs: 4.3 10*3/uL (ref 1.7–7.7)
PLATELETS: 202 10*3/uL (ref 150–400)
RBC: 4.17 MIL/uL — AB (ref 4.22–5.81)
RDW: 13.2 % (ref 11.5–15.5)
WBC: 6 10*3/uL (ref 4.0–10.5)

## 2016-06-18 LAB — COMPREHENSIVE METABOLIC PANEL
ALK PHOS: 63 U/L (ref 38–126)
ALT: 14 U/L — AB (ref 17–63)
ANION GAP: 9 (ref 5–15)
AST: 24 U/L (ref 15–41)
Albumin: 3.9 g/dL (ref 3.5–5.0)
BUN: 19 mg/dL (ref 6–20)
CALCIUM: 9 mg/dL (ref 8.9–10.3)
CHLORIDE: 108 mmol/L (ref 101–111)
CO2: 23 mmol/L (ref 22–32)
Creatinine, Ser: 1.15 mg/dL (ref 0.61–1.24)
GFR calc Af Amer: >60 mL/min (ref >60)
GFR calc non Af Amer: >60 mL/min (ref >60)
Glucose, Bld: 99 mg/dL (ref 65–99)
Potassium: 4 mmol/L (ref 3.5–5.1)
SODIUM: 140 mmol/L (ref 135–145)
Total Bilirubin: 0.5 mg/dL (ref 0.3–1.2)
Total Protein: 7 g/dL (ref 6.5–8.1)

## 2016-06-18 NOTE — Progress Notes (Signed)
PCP: [name] [facility] in [place]  Cardiologist Dr. Gwenlyn Found

## 2016-06-26 MED ORDER — BUPIVACAINE LIPOSOME 1.3 % IJ SUSP
20.0000 mL | Freq: Once | INTRAMUSCULAR | Status: AC
  Administered 2016-06-29: 20 mL
  Filled 2016-06-26: qty 20

## 2016-06-26 MED ORDER — SODIUM CHLORIDE 0.9 % IV SOLN
1000.0000 mg | INTRAVENOUS | Status: AC
  Administered 2016-06-29: 1000 mg via INTRAVENOUS
  Filled 2016-06-26: qty 10

## 2016-06-28 NOTE — H&P (Signed)
Marvin Ingram MRN:  [id number] DOB/SEX:  10-18-1945/male  CHIEF COMPLAINT:  Painful right Knee  HISTORY: Patient is a 71 y.o. male presented with a history of pain in the right knee. Onset of symptoms was gradual starting a few years ago with gradually worsening course since that time. Patient has been treated conservatively with over-the-counter NSAIDs and activity modification. Patient currently rates pain in the knee at 10 out of 10 with activity. There is pain at night.  PAST MEDICAL HISTORY: Patient Active Problem List   Diagnosis Date Noted  . Palpitations 01/23/2016  . CAD (coronary artery disease) of artery bypass graft 01/19/2014  . Hyperlipidemia 01/19/2014   Past Medical History:  Diagnosis Date  . Arthritis   . Basal cell carcinoma   . Bradycardia    relative  . CAD (coronary artery disease)   . Heart disease   . Hyperlipidemia   . Myocardial infarction [facility]) 2007   has 2 stents  . PONV (postoperative nausea and vomiting)    problems yrears ago in 1963  . Seborrheic keratoses    Past Surgical History:  Procedure Laterality Date  . back (disc),  1993  . CATARACT EXTRACTION Right 2016  . DOPPLER ECHOCARDIOGRAPHY    . GALLBLADDER SURGERY  1994  . KNEE ARTHROPLASTY  1963/1990  . NM MYOVIEW LTD       MEDICATIONS:   No prescriptions prior to admission.    ALLERGIES:   Allergies  Allergen Reactions  . No Known Allergies     REVIEW OF SYSTEMS:  A comprehensive review of systems was negative except for: Musculoskeletal: positive for arthralgias and bone pain   FAMILY HISTORY:  No family history on file.  SOCIAL HISTORY:   Social History  Substance Use Topics  . Smoking status: Former Smoker    Years: 2.00    Types: Cigarettes  . Smokeless tobacco: Never Used     Comment: smoked off and on during college  . Alcohol use Not on file     Comment: occassionally     EXAMINATION:  Vital signs in last 24 hours:    There were no vitals taken for  this visit.  General Appearance:    Alert, cooperative, no distress, appears stated age  Head:    Normocephalic, without obvious abnormality, atraumatic  Eyes:    PERRL, conjunctiva/corneas clear, EOM's intact, fundi    benign, both eyes       Ears:    Normal TM's and external ear canals, both ears  Nose:   Nares normal, septum midline, mucosa normal, no drainage    or sinus tenderness  Throat:   Lips, mucosa, and tongue normal; teeth and gums normal  Neck:   Supple, symmetrical, trachea midline, no adenopathy;       thyroid:  No enlargement/tenderness/nodules; no carotid   bruit or JVD  Back:     Symmetric, no curvature, ROM normal, no CVA tenderness  Lungs:     Clear to auscultation bilaterally, respirations unlabored  Chest wall:    No tenderness or deformity  Heart:    Regular rate and rhythm, S1 and S2 normal, no murmur, rub   or gallop  Abdomen:     Soft, non-tender, bowel sounds active all four quadrants,    no masses, no organomegaly  Genitalia:    Normal male without lesion, discharge or tenderness  Rectal:    Normal tone, normal prostate, no masses or tenderness;   guaiac negative stool  Extremities:  Extremities normal, atraumatic, no cyanosis or edema  Pulses:   2+ and symmetric all extremities  Skin:   Skin color, texture, turgor normal, no rashes or lesions  Lymph nodes:   Cervical, supraclavicular, and axillary nodes normal  Neurologic:   CNII-XII intact. Normal strength, sensation and reflexes      throughout    Musculoskeletal:  ROM 0-120, Ligaments intact,  Imaging Review Plain radiographs demonstrate severe degenerative joint disease of the right knee. The overall alignment is neutral. The bone quality appears to be good for age and reported activity level.  Assessment/Plan: Primary osteoarthritis, right knee   The patient history, physical examination and imaging studies are consistent with advanced degenerative joint disease of the right knee. The patient has  failed conservative treatment.  The clearance notes were reviewed.  After discussion with the patient it was felt that Total Knee Replacement was indicated. The procedure,  risks, and benefits of total knee arthroplasty were presented and reviewed. The risks including but not limited to aseptic loosening, infection, blood clots, vascular injury, stiffness, patella tracking problems complications among others were discussed. The patient acknowledged the explanation, agreed to proceed with the plan.  Donia Ast 06/28/2016, 9:54 PM

## 2016-06-29 ENCOUNTER — Encounter (HOSPITAL_COMMUNITY): Payer: Self-pay | Admitting: *Deleted

## 2016-06-29 ENCOUNTER — Encounter (HOSPITAL_COMMUNITY)
Admission: RE | Disposition: A | Discharge status: Home / Self Care | Payer: Self-pay | Source: Ambulatory Visit | Attending: Orthopedic Surgery

## 2016-06-29 ENCOUNTER — Observation Stay (HOSPITAL_COMMUNITY)
Admission: RE | Admit: 2016-06-29 | Discharge: 2016-06-30 | Disposition: A | Discharge status: Home / Self Care | Payer: PPO | Source: Ambulatory Visit | Attending: Orthopedic Surgery | Admitting: Orthopedic Surgery

## 2016-06-29 ENCOUNTER — Ambulatory Visit (HOSPITAL_COMMUNITY): Payer: PPO | Admitting: Anesthesiology

## 2016-06-29 DIAGNOSIS — I252 Old myocardial infarction: Secondary | ICD-10-CM | POA: Diagnosis not present

## 2016-06-29 DIAGNOSIS — G8918 Other acute postprocedural pain: Secondary | ICD-10-CM | POA: Diagnosis not present

## 2016-06-29 DIAGNOSIS — M25561 Pain in right knee: Secondary | ICD-10-CM | POA: Diagnosis not present

## 2016-06-29 DIAGNOSIS — Z955 Presence of coronary angioplasty implant and graft: Secondary | ICD-10-CM | POA: Diagnosis not present

## 2016-06-29 DIAGNOSIS — Z87891 Personal history of nicotine dependence: Secondary | ICD-10-CM | POA: Insufficient documentation

## 2016-06-29 DIAGNOSIS — M1711 Unilateral primary osteoarthritis, right knee: Principal | ICD-10-CM | POA: Insufficient documentation

## 2016-06-29 DIAGNOSIS — Z96659 Presence of unspecified artificial knee joint: Secondary | ICD-10-CM

## 2016-06-29 DIAGNOSIS — E785 Hyperlipidemia, unspecified: Secondary | ICD-10-CM | POA: Diagnosis not present

## 2016-06-29 DIAGNOSIS — I2510 Atherosclerotic heart disease of native coronary artery without angina pectoris: Secondary | ICD-10-CM | POA: Diagnosis not present

## 2016-06-29 DIAGNOSIS — I25810 Atherosclerosis of coronary artery bypass graft(s) without angina pectoris: Secondary | ICD-10-CM | POA: Diagnosis not present

## 2016-06-29 HISTORY — PX: TOTAL KNEE ARTHROPLASTY: SHX125

## 2016-06-29 SURGERY — ARTHROPLASTY, KNEE, TOTAL
Anesthesia: Spinal | Site: Knee | Laterality: Right

## 2016-06-29 MED ORDER — SENNOSIDES-DOCUSATE SODIUM 8.6-50 MG PO TABS
1.0000 | ORAL_TABLET | Freq: Every evening | ORAL | Status: DC | PRN
  Administered 2016-06-30: 1 via ORAL
  Filled 2016-06-29: qty 1

## 2016-06-29 MED ORDER — BUPIVACAINE-EPINEPHRINE (PF) 0.5% -1:200000 IJ SOLN
INTRAMUSCULAR | Status: DC | PRN
  Administered 2016-06-29: 25 mL via PERINEURAL

## 2016-06-29 MED ORDER — GABAPENTIN 300 MG PO CAPS
300.0000 mg | ORAL_CAPSULE | Freq: Three times a day (TID) | ORAL | Status: DC
  Administered 2016-06-29 – 2016-06-30 (×3): 300 mg via ORAL
  Filled 2016-06-29 (×3): qty 1

## 2016-06-29 MED ORDER — SODIUM CHLORIDE 0.9 % IV SOLN
INTRAVENOUS | Status: DC
  Administered 2016-06-29: 17:00:00 via INTRAVENOUS

## 2016-06-29 MED ORDER — METOCLOPRAMIDE HCL 5 MG/ML IJ SOLN: 5.0000 mg | Freq: Three times a day (TID) | INTRAMUSCULAR | Status: DC | PRN

## 2016-06-29 MED ORDER — BUPIVACAINE HCL (PF) 0.75 % IJ SOLN
INTRAMUSCULAR | Status: DC | PRN
  Administered 2016-06-29: 2 mL via INTRATHECAL

## 2016-06-29 MED ORDER — CEFAZOLIN SODIUM-DEXTROSE 2-4 GM/100ML-% IV SOLN
2.0000 g | INTRAVENOUS | Status: AC
  Administered 2016-06-29: 2 g via INTRAVENOUS
  Filled 2016-06-29: qty 100

## 2016-06-29 MED ORDER — ATORVASTATIN CALCIUM 10 MG PO TABS
5.0000 mg | ORAL_TABLET | Freq: Every day | ORAL | Status: DC
  Administered 2016-06-29: 5 mg via ORAL
  Filled 2016-06-29: qty 1

## 2016-06-29 MED ORDER — BUPIVACAINE-EPINEPHRINE 0.5% -1:200000 IJ SOLN
INTRAMUSCULAR | Status: DC | PRN
  Administered 2016-06-29: 30 mL

## 2016-06-29 MED ORDER — FENTANYL CITRATE (PF) 250 MCG/5ML IJ SOLN
INTRAMUSCULAR | Status: AC
  Filled 2016-06-29: qty 5

## 2016-06-29 MED ORDER — DEXTROSE 5 % IV SOLN
500.0000 mg | Freq: Four times a day (QID) | INTRAVENOUS | Status: DC | PRN
  Filled 2016-06-29: qty 5

## 2016-06-29 MED ORDER — TRANEXAMIC ACID 1000 MG/10ML IV SOLN
1000.0000 mg | Freq: Once | INTRAVENOUS | Status: AC
  Administered 2016-06-29: 1000 mg via INTRAVENOUS
  Filled 2016-06-29: qty 10

## 2016-06-29 MED ORDER — GABAPENTIN 300 MG PO CAPS
300.0000 mg | ORAL_CAPSULE | Freq: Once | ORAL | Status: AC
  Administered 2016-06-29: 300 mg via ORAL
  Filled 2016-06-29: qty 1

## 2016-06-29 MED ORDER — MIDAZOLAM HCL 2 MG/2ML IJ SOLN
INTRAMUSCULAR | Status: AC
  Administered 2016-06-29: 1 mg via INTRAVENOUS
  Filled 2016-06-29: qty 2

## 2016-06-29 MED ORDER — FENTANYL CITRATE (PF) 100 MCG/2ML IJ SOLN
50.0000 ug | Freq: Once | INTRAMUSCULAR | Status: AC
  Administered 2016-06-29: 50 ug via INTRAVENOUS

## 2016-06-29 MED ORDER — LACTATED RINGERS IV SOLN
Freq: Once | INTRAVENOUS | Status: AC
  Administered 2016-06-29: 07:00:00 via INTRAVENOUS

## 2016-06-29 MED ORDER — MENTHOL 3 MG MT LOZG: 1.0000 | LOZENGE | OROMUCOSAL | Status: DC | PRN

## 2016-06-29 MED ORDER — LACTATED RINGERS IV SOLN
INTRAVENOUS | Status: DC | PRN
  Administered 2016-06-29 (×2): via INTRAVENOUS

## 2016-06-29 MED ORDER — EPHEDRINE SULFATE 50 MG/ML IJ SOLN
INTRAMUSCULAR | Status: DC | PRN
  Administered 2016-06-29: 10 mg via INTRAVENOUS
  Administered 2016-06-29 (×2): 5 mg via INTRAVENOUS

## 2016-06-29 MED ORDER — CELECOXIB 200 MG PO CAPS
200.0000 mg | ORAL_CAPSULE | Freq: Two times a day (BID) | ORAL | Status: DC
  Administered 2016-06-29 – 2016-06-30 (×2): 200 mg via ORAL
  Filled 2016-06-29 (×2): qty 1

## 2016-06-29 MED ORDER — EPHEDRINE 5 MG/ML INJ
INTRAVENOUS | Status: AC
  Filled 2016-06-29: qty 10

## 2016-06-29 MED ORDER — FENTANYL CITRATE (PF) 100 MCG/2ML IJ SOLN
INTRAMUSCULAR | Status: AC
  Administered 2016-06-29: 50 ug via INTRAVENOUS
  Filled 2016-06-29: qty 2

## 2016-06-29 MED ORDER — DEXAMETHASONE SODIUM PHOSPHATE 10 MG/ML IJ SOLN
10.0000 mg | Freq: Once | INTRAMUSCULAR | Status: AC
  Administered 2016-06-30: 10 mg via INTRAVENOUS
  Filled 2016-06-29: qty 1

## 2016-06-29 MED ORDER — DOCUSATE SODIUM 100 MG PO CAPS
100.0000 mg | ORAL_CAPSULE | Freq: Two times a day (BID) | ORAL | Status: DC
  Administered 2016-06-29 – 2016-06-30 (×2): 100 mg via ORAL
  Filled 2016-06-29 (×2): qty 1

## 2016-06-29 MED ORDER — FLEET ENEMA 7-19 GM/118ML RE ENEM: 1.0000 | ENEMA | Freq: Once | RECTAL | Status: DC | PRN

## 2016-06-29 MED ORDER — ACETAMINOPHEN 650 MG RE SUPP: 650.0000 mg | Freq: Four times a day (QID) | RECTAL | Status: DC | PRN

## 2016-06-29 MED ORDER — HYDROCODONE-ACETAMINOPHEN 7.5-325 MG PO TABS
1.0000 | ORAL_TABLET | Freq: Four times a day (QID) | ORAL | Status: DC
  Administered 2016-06-29 – 2016-06-30 (×4): 1 via ORAL
  Filled 2016-06-29 (×4): qty 1

## 2016-06-29 MED ORDER — ACETAMINOPHEN 500 MG PO TABS
1000.0000 mg | ORAL_TABLET | Freq: Once | ORAL | Status: AC
  Administered 2016-06-29: 1000 mg via ORAL
  Filled 2016-06-29: qty 2

## 2016-06-29 MED ORDER — PROPOFOL 500 MG/50ML IV EMUL
INTRAVENOUS | Status: DC | PRN
  Administered 2016-06-29: 75 ug/kg/min via INTRAVENOUS

## 2016-06-29 MED ORDER — DIPHENHYDRAMINE HCL 12.5 MG/5ML PO ELIX
12.5000 mg | ORAL_SOLUTION | ORAL | Status: DC | PRN
Start: 2016-06-29 — End: 2016-06-30

## 2016-06-29 MED ORDER — OXYCODONE HCL 5 MG PO TABS
5.0000 mg | ORAL_TABLET | ORAL | Status: DC | PRN
  Administered 2016-06-29 – 2016-06-30 (×3): 10 mg via ORAL
  Filled 2016-06-29 (×4): qty 2

## 2016-06-29 MED ORDER — ACETAMINOPHEN 325 MG PO TABS: 650.0000 mg | ORAL_TABLET | Freq: Four times a day (QID) | ORAL | Status: DC | PRN

## 2016-06-29 MED ORDER — ZOLPIDEM TARTRATE 5 MG PO TABS: 5.0000 mg | ORAL_TABLET | Freq: Every evening | ORAL | Status: DC | PRN

## 2016-06-29 MED ORDER — PHENOL 1.4 % MT LIQD: 1.0000 | OROMUCOSAL | Status: DC | PRN

## 2016-06-29 MED ORDER — EPINEPHRINE PF 1 MG/ML IJ SOLN
INTRAMUSCULAR | Status: AC
  Filled 2016-06-29: qty 1

## 2016-06-29 MED ORDER — SODIUM CHLORIDE 0.9 % IJ SOLN
INTRAMUSCULAR | Status: DC | PRN
  Administered 2016-06-29: 20 mL

## 2016-06-29 MED ORDER — METHOCARBAMOL 500 MG PO TABS
500.0000 mg | ORAL_TABLET | Freq: Four times a day (QID) | ORAL | Status: DC | PRN
  Administered 2016-06-29 – 2016-06-30 (×3): 500 mg via ORAL
  Filled 2016-06-29 (×3): qty 1

## 2016-06-29 MED ORDER — PROPOFOL 10 MG/ML IV BOLUS
INTRAVENOUS | Status: AC
  Filled 2016-06-29: qty 20

## 2016-06-29 MED ORDER — HYDROMORPHONE HCL 1 MG/ML IJ SOLN
1.0000 mg | INTRAMUSCULAR | Status: DC | PRN
  Administered 2016-06-29: 1 mg via INTRAVENOUS
  Filled 2016-06-29 (×2): qty 1

## 2016-06-29 MED ORDER — ALUM & MAG HYDROXIDE-SIMETH 200-200-20 MG/5ML PO SUSP: 30.0000 mL | ORAL | Status: DC | PRN

## 2016-06-29 MED ORDER — CEFAZOLIN SODIUM-DEXTROSE 1-4 GM/50ML-% IV SOLN
1.0000 g | Freq: Four times a day (QID) | INTRAVENOUS | Status: AC
  Administered 2016-06-29 (×2): 1 g via INTRAVENOUS
  Filled 2016-06-29 (×2): qty 50

## 2016-06-29 MED ORDER — BISACODYL 5 MG PO TBEC: 5.0000 mg | DELAYED_RELEASE_TABLET | Freq: Every day | ORAL | Status: DC | PRN

## 2016-06-29 MED ORDER — ONDANSETRON HCL 4 MG PO TABS
4.0000 mg | ORAL_TABLET | Freq: Four times a day (QID) | ORAL | Status: DC | PRN
Start: 2016-06-29 — End: 2016-06-30

## 2016-06-29 MED ORDER — METOCLOPRAMIDE HCL 5 MG PO TABS: 5.0000 mg | ORAL_TABLET | Freq: Three times a day (TID) | ORAL | Status: DC | PRN

## 2016-06-29 MED ORDER — CHLORHEXIDINE GLUCONATE 4 % EX LIQD: 60.0000 mL | Freq: Once | CUTANEOUS | Status: DC

## 2016-06-29 MED ORDER — ASPIRIN EC 325 MG PO TBEC
325.0000 mg | DELAYED_RELEASE_TABLET | Freq: Two times a day (BID) | ORAL | Status: DC
  Administered 2016-06-29 – 2016-06-30 (×2): 325 mg via ORAL
  Filled 2016-06-29 (×2): qty 1

## 2016-06-29 MED ORDER — BUPIVACAINE HCL (PF) 0.5 % IJ SOLN
INTRAMUSCULAR | Status: AC
  Filled 2016-06-29: qty 30

## 2016-06-29 MED ORDER — DEXAMETHASONE SODIUM PHOSPHATE 10 MG/ML IJ SOLN
8.0000 mg | Freq: Once | INTRAMUSCULAR | Status: AC
  Administered 2016-06-29: 8 mg via INTRAVENOUS
  Filled 2016-06-29: qty 1

## 2016-06-29 MED ORDER — DEXAMETHASONE SODIUM PHOSPHATE 10 MG/ML IJ SOLN
INTRAMUSCULAR | Status: AC
  Filled 2016-06-29: qty 1

## 2016-06-29 MED ORDER — SODIUM CHLORIDE 0.9 % IR SOLN
Status: DC | PRN
  Administered 2016-06-29: 3000 mL

## 2016-06-29 MED ORDER — MIDAZOLAM HCL 2 MG/2ML IJ SOLN
1.0000 mg | Freq: Once | INTRAMUSCULAR | Status: AC
  Administered 2016-06-29: 1 mg via INTRAVENOUS

## 2016-06-29 MED ORDER — ONDANSETRON HCL 4 MG/2ML IJ SOLN: 4.0000 mg | Freq: Four times a day (QID) | INTRAMUSCULAR | Status: DC | PRN

## 2016-06-29 SURGICAL SUPPLY — 63 items
BANDAGE ACE 6X5 VEL STRL LF (GAUZE/BANDAGES/DRESSINGS) ×3 IMPLANT
BANDAGE ESMARK 6X9 LF (GAUZE/BANDAGES/DRESSINGS) ×1 IMPLANT
BLADE SAGITTAL 13X1.27X60 (BLADE) ×2 IMPLANT
BLADE SAGITTAL 13X1.27X60MM (BLADE) ×1
BLADE SAW SGTL 83.5X18.5 (BLADE) ×3 IMPLANT
BLADE SURG 10 STRL SS (BLADE) ×3 IMPLANT
BNDG CMPR 9X6 STRL LF SNTH (GAUZE/BANDAGES/DRESSINGS) ×1
BNDG ESMARK 6X9 LF (GAUZE/BANDAGES/DRESSINGS) ×3
BOWL SMART MIX CTS (DISPOSABLE) ×3 IMPLANT
CAPT KNEE TOTAL 3 ×3 IMPLANT
CEMENT BONE SIMPLEX SPEEDSET (Cement) ×6 IMPLANT
CLOSURE STERI-STRIP 1/2X4 (GAUZE/BANDAGES/DRESSINGS) ×1
CLOSURE WOUND 1/2 X4 (GAUZE/BANDAGES/DRESSINGS) ×1
CLSR STERI-STRIP ANTIMIC 1/2X4 (GAUZE/BANDAGES/DRESSINGS) ×2 IMPLANT
COVER SURGICAL LIGHT HANDLE (MISCELLANEOUS) ×3 IMPLANT
CUFF TOURNIQUET SINGLE 34IN LL (TOURNIQUET CUFF) ×3 IMPLANT
DRAPE EXTREMITY T 121X128X90 (DRAPE) ×3 IMPLANT
DRAPE HALF SHEET 40X57 (DRAPES) ×3 IMPLANT
DRAPE INCISE IOBAN 66X45 STRL (DRAPES) ×6 IMPLANT
DRAPE U-SHAPE 47X51 STRL (DRAPES) ×3 IMPLANT
DRSG AQUACEL AG ADV 3.5X10 (GAUZE/BANDAGES/DRESSINGS) ×3 IMPLANT
DURAPREP 26ML APPLICATOR (WOUND CARE) ×6 IMPLANT
ELECT REM PT RETURN 9FT ADLT (ELECTROSURGICAL) ×3
ELECTRODE REM PT RTRN 9FT ADLT (ELECTROSURGICAL) ×1 IMPLANT
FILTER STRAW FLUID ASPIR (MISCELLANEOUS) ×3 IMPLANT
GLOVE BIOGEL M 7.0 STRL (GLOVE) ×3 IMPLANT
GLOVE BIOGEL PI IND STRL 7.5 (GLOVE) ×1 IMPLANT
GLOVE BIOGEL PI IND STRL 8.5 (GLOVE) ×1 IMPLANT
GLOVE BIOGEL PI INDICATOR 7.5 (GLOVE) ×2
GLOVE BIOGEL PI INDICATOR 8.5 (GLOVE) ×2
GLOVE SURG ORTHO 8.0 STRL STRW (GLOVE) ×6 IMPLANT
GOWN STRL REUS W/ TWL LRG LVL3 (GOWN DISPOSABLE) ×1 IMPLANT
GOWN STRL REUS W/ TWL XL LVL3 (GOWN DISPOSABLE) ×2 IMPLANT
GOWN STRL REUS W/TWL 2XL LVL3 (GOWN DISPOSABLE) ×3 IMPLANT
GOWN STRL REUS W/TWL LRG LVL3 (GOWN DISPOSABLE) ×3
GOWN STRL REUS W/TWL XL LVL3 (GOWN DISPOSABLE) ×6
HANDPIECE INTERPULSE COAX TIP (DISPOSABLE) ×3
HOOD PEEL AWAY FACE SHEILD DIS (HOOD) ×9 IMPLANT
KIT BASIN OR (CUSTOM PROCEDURE TRAY) ×3 IMPLANT
KIT ROOM TURNOVER OR (KITS) ×3 IMPLANT
KNEE CAPITATED TOTAL 3 ×1 IMPLANT
MANIFOLD NEPTUNE II (INSTRUMENTS) ×3 IMPLANT
NEEDLE 18GX1X1/2 (RX/OR ONLY) (NEEDLE) ×3 IMPLANT
NEEDLE 22X1 1/2 (OR ONLY) (NEEDLE) ×6 IMPLANT
NS IRRIG 1000ML POUR BTL (IV SOLUTION) ×3 IMPLANT
PACK TOTAL JOINT (CUSTOM PROCEDURE TRAY) ×3 IMPLANT
PAD ARMBOARD 7.5X6 YLW CONV (MISCELLANEOUS) ×6 IMPLANT
SET HNDPC FAN SPRY TIP SCT (DISPOSABLE) ×1 IMPLANT
STRIP CLOSURE SKIN 1/2X4 (GAUZE/BANDAGES/DRESSINGS) ×2 IMPLANT
SUCTION FRAZIER HANDLE 10FR (MISCELLANEOUS) ×2
SUCTION TUBE FRAZIER 10FR DISP (MISCELLANEOUS) ×1 IMPLANT
SUT MNCRL AB 3-0 PS2 18 (SUTURE) ×3 IMPLANT
SUT VIC AB 0 CTB1 27 (SUTURE) ×6 IMPLANT
SUT VIC AB 1 CT1 27 (SUTURE) ×6
SUT VIC AB 1 CT1 27XBRD ANBCTR (SUTURE) ×2 IMPLANT
SUT VIC AB 2-0 CT1 27 (SUTURE) ×6
SUT VIC AB 2-0 CT1 TAPERPNT 27 (SUTURE) ×2 IMPLANT
SYR 20CC LL (SYRINGE) ×6 IMPLANT
SYR TB 1ML LUER SLIP (SYRINGE) ×3 IMPLANT
TOWEL OR 17X24 6PK STRL BLUE (TOWEL DISPOSABLE) ×3 IMPLANT
TOWEL OR 17X26 10 PK STRL BLUE (TOWEL DISPOSABLE) ×3 IMPLANT
TRAY CATH 16FR W/PLASTIC CATH (SET/KITS/TRAYS/PACK) ×3 IMPLANT
WRAP KNEE MAXI GEL POST OP (GAUZE/BANDAGES/DRESSINGS) ×3 IMPLANT

## 2016-06-29 NOTE — Transfer of Care (Signed)
Immediate Anesthesia Transfer of Care Note  Patient: Marvin Ingram  Procedure(s) Performed: Procedure(s): TOTAL KNEE ARTHROPLASTY (Right)  Patient Location: PACU  Anesthesia Type:Spinal and MAC combined with regional for post-op pain  Level of Consciousness: drowsy  Airway & Oxygen Therapy: Patient Spontanous Breathing  Post-op Assessment: Report given to RN and Post -op Vital signs reviewed and stable  Post vital signs: Reviewed and stable  Last Vitals:  Vitals:   06/29/16 0800 06/29/16 1027  BP: 120/64   Pulse: (!) 59 64  Resp: (!) 26 11  Temp:  36.3 C    Last Pain:  Vitals:   06/29/16 1027  TempSrc:   PainSc: 0-No pain      Patients Stated Pain Goal: 5 (22/57/50 5183)  Complications: No apparent anesthesia complications

## 2016-06-29 NOTE — Anesthesia Postprocedure Evaluation (Addendum)
Anesthesia Post Note  Patient: Marvin Ingram  Procedure(s) Performed: Procedure(s) (LRB): TOTAL KNEE ARTHROPLASTY (Right)  Patient location during evaluation: PACU Anesthesia Type: Spinal Level of consciousness: oriented and awake and alert Pain management: pain level controlled Vital Signs Assessment: post-procedure vital signs reviewed and stable Respiratory status: spontaneous breathing, respiratory function stable and patient connected to nasal cannula oxygen Cardiovascular status: blood pressure returned to baseline and stable Postop Assessment: no headache and no backache Anesthetic complications: no       Last Vitals:  Vitals:   06/29/16 1145 06/29/16 1150  BP: 140/70   Pulse: (!) 56 62  Resp: 16 11  Temp:      Last Pain:  Vitals:   06/29/16 1150  TempSrc:   PainSc: 0-No pain    LLE Motor Response: No movement due to regional block (06/29/16 1150) LLE Sensation: Decreased (06/29/16 1150) RLE Motor Response: No movement due to regional block (06/29/16 1150) RLE Sensation: Decreased (06/29/16 1150) L Sensory Level: S1-Sole of foot, small toes (06/29/16 1150) R Sensory Level: S1-Sole of foot, small toes (06/29/16 1150)  [name],[name]

## 2016-06-29 NOTE — Anesthesia Procedure Notes (Addendum)
Anesthesia Regional Block: Adductor canal block   Pre-Anesthetic Checklist: ,, timeout performed, Correct Patient, Correct Site, Correct Laterality, Correct Procedure, Correct Position, site marked, Risks and benefits discussed,  Surgical consent,  Pre-op evaluation,  At surgeon's request and post-op pain management  Laterality: Right and Lower  Prep: chloraprep       Needles:   Needle Type: Echogenic Stimulator Needle     Needle Length: 9cm  Needle Gauge: 21   Needle insertion depth: 5 cm   Additional Needles:   Procedures: ultrasound guided,,,,,,,,  Narrative:  Start time: 06/29/2016 7:45 AM End time: 06/29/2016 8:00 AM Injection made incrementally with aspirations every 5 mL.  Performed by: Personally  Anesthesiologist: Ovidio Steele

## 2016-06-29 NOTE — Evaluation (Signed)
Physical Therapy Evaluation Patient Details Name: Marvin Ingram MRN: [id number] DOB: May 10, 1945 Today's Date: 06/29/2016   History of Present Illness  Pt is a 71 yo male s/p L TKA on 4/23. PMH: basal cell carcinoma, bradycardia, cad, arthritis.  Clinical Impression  Pt is s/p TKA resulting in the deficits listed below (see PT Problem List). Pt tolerated OOB mobility well for first time. Anticipate he will progress well. Pt will benefit from skilled PT to increase their independence and safety with mobility to allow discharge to the venue listed below.      Follow Up Recommendations Home health PT;Supervision/Assistance - 24 hour    Equipment Recommendations  Rolling walker with 5" wheels (i believe he already has)    Recommendations for Other Services       Precautions / Restrictions Precautions Precautions: Knee;Fall Precaution Booklet Issued: Yes (comment) Precaution Comments: pt given handout, pt and spouse with verbal understanding Restrictions Weight Bearing Restrictions: Yes LLE Weight Bearing: Weight bearing as tolerated      Mobility  Bed Mobility Overal bed mobility: Needs Assistance Bed Mobility: Supine to Sit     Supine to sit: Min assist     General bed mobility comments: verbal directional cues for long sit technique, minA for LE mangement, used bed rail  Transfers Overall transfer level: Needs assistance Equipment used: Rolling walker (2 wheeled) Transfers: Sit to/from [facility] Sit to Stand: Min assist;Mod assist Stand pivot transfers: Min assist;Mod assist       General transfer comment: v/c's for hand placement and to complete R quad set in standing to prevent R knee buckling. step by step directional v/c's for sequencing stepping during std pvt transfer to chair, no R knee buckling noted  Ambulation/Gait             General Gait Details: completed 5 steps to chair  Stairs            Wheelchair Mobility     Modified Rankin (Stroke Patients Only)       Balance Overall balance assessment: Needs assistance Sitting-balance support: Feet supported;Bilateral upper extremity supported Sitting balance-Leahy Scale: Fair     Standing balance support: Bilateral upper extremity supported Standing balance-Leahy Scale: Fair Standing balance comment: requires RW for safe standing                             Pertinent Vitals/Pain Pain Assessment: 0-10 Pain Score: 2  Pain Location: L knee Pain Descriptors / Indicators: Sore Pain Intervention(s): Monitored during session    Home Living Family/patient expects to be discharged to:: Private residence Living Arrangements: Spouse/significant other Available Help at Discharge: Family;Available 24 hours/day Type of Home: House Home Access: Stairs to enter Entrance Stairs-Rails: Can reach both Entrance Stairs-Number of Steps: 5 Home Layout: One level Home Equipment: Walker - 2 wheels;Bedside commode;Tub bench Additional Comments: cpm    Prior Function Level of Independence: Independent               Hand Dominance   Dominant Hand: Right    Extremity/Trunk Assessment   Upper Extremity Assessment Upper Extremity Assessment: Overall WFL for tasks assessed    Lower Extremity Assessment Lower Extremity Assessment: LLE deficits/detail LLE Deficits / Details: pt able to initiate quad set, PROM to knee to 45deg    Cervical / Trunk Assessment Cervical / Trunk Assessment: Normal  Communication   Communication: No difficulties  Cognition Arousal/Alertness: Awake/alert Behavior During Therapy: [facility] for  tasks assessed/performed Overall Cognitive Status: Within Functional Limits for tasks assessed                                        General Comments General comments (skin integrity, edema, etc.): dressing intact    Exercises     Assessment/Plan    PT Assessment Patient needs continued PT services   PT Problem List Decreased strength;Decreased range of motion;Decreased activity tolerance;Decreased balance;Decreased mobility;Decreased coordination;Decreased knowledge of use of DME;Pain       PT Treatment Interventions DME instruction;Stair training;Gait training;Functional mobility training;Therapeutic activities;Therapeutic exercise;Balance training    PT Goals (Current goals can be found in the Care Plan section)  Acute Rehab PT Goals Patient Stated Goal: home tomorrow PT Goal Formulation: With patient/family Time For Goal Achievement: 07/06/16 Potential to Achieve Goals: Good    Frequency 7X/week   Barriers to discharge        Co-evaluation               End of Session Equipment Utilized During Treatment: Gait belt Activity Tolerance: Patient tolerated treatment well Patient left: in chair;with call bell/phone within reach;with family/visitor present Nurse Communication: Mobility status (CPM was not in proper fitting on patient and not turned on) PT Visit Diagnosis: Muscle weakness (generalized) (M62.81);Difficulty in walking, not elsewhere classified (R26.2)    Time: 2376-2831 PT Time Calculation (min) (ACUTE ONLY): 21 min   Charges:   PT Evaluation $PT Eval Moderate Complexity: 1 Procedure     PT G Codes:   PT G-Codes **NOT FOR INPATIENT CLASS** Functional Assessment Tool Used: Clinical judgement Functional Limitation: Mobility: Walking and moving around Mobility: Walking and Moving Around Current Status (D1761): At least 40 percent but less than 60 percent impaired, limited or restricted Mobility: Walking and Moving Around Goal Status [phone]): At least 1 percent but less than 20 percent impaired, limited or restricted   Kittie Plater, PT, DPT Pager #: [phone] Office #: [phone]   [place] 06/29/2016, 4:31 PM

## 2016-06-29 NOTE — Anesthesia Procedure Notes (Signed)
Spinal  Patient location during procedure: OR Start time: 06/29/2016 8:15 AM End time: 06/29/2016 8:30 AM Staffing Performed: anesthesiologist  Preanesthetic Checklist Completed: patient identified, site marked, surgical consent, pre-op evaluation, timeout performed, IV checked, risks and benefits discussed and monitors and equipment checked Spinal Block Patient position: sitting Prep: ChloraPrep Patient monitoring: cardiac monitor, heart rate, continuous pulse ox and blood pressure Approach: right paramedian Needle Needle type: Quincke  Needle gauge: 25 G Needle length: 9 cm Needle insertion depth: 6 cm Assessment Sensory level: T6

## 2016-06-29 NOTE — Anesthesia Preprocedure Evaluation (Addendum)
Anesthesia Evaluation  Patient identified by MRN, date of birth, ID band Patient awake    Reviewed: Allergy & Precautions, NPO status , Patient's Chart, lab work & pertinent test results  History of Anesthesia Complications (+) PONV  Airway Mallampati: II  TM Distance: >3 FB Neck ROM: Full    Dental  (+) Teeth Intact   Pulmonary former smoker,    breath sounds clear to auscultation       Cardiovascular + CAD, + Past MI and + Cardiac Stents   Rhythm:Regular Rate:Normal     Neuro/Psych    GI/Hepatic negative GI ROS, Neg liver ROS,   Endo/Other  negative endocrine ROS  Renal/GU negative Renal ROS     Musculoskeletal  (+) Arthritis ,   Abdominal   Peds  Hematology negative hematology ROS (+)   Anesthesia Other Findings   Reproductive/Obstetrics                            Anesthesia Physical Anesthesia Plan  ASA: III  Anesthesia Plan: Spinal   Post-op Pain Management:    Induction: Intravenous  Airway Management Planned: Simple Face Mask  Additional Equipment:   Intra-op Plan:   Post-operative Plan: Extubation in OR  Informed Consent: I have reviewed the patients History and Physical, chart, labs and discussed the procedure including the risks, benefits and alternatives for the proposed anesthesia with the patient or authorized representative who has indicated his/her understanding and acceptance.   Dental advisory given  Plan Discussed with:   Anesthesia Plan Comments:        Anesthesia Quick Evaluation

## 2016-06-29 NOTE — Progress Notes (Signed)
Orthopedic Tech Progress Note Patient Details:  Marvin Ingram 1945/07/12 [id number]  CPM Right Knee CPM Right Knee: On Right Knee Flexion (Degrees): 90 Right Knee Extension (Degrees): 0 Additional Comments: trapeze bar patient helper   [name] 06/29/2016, 11:33 AM Viewed order from doctor's order list

## 2016-06-29 NOTE — Anesthesia Procedure Notes (Signed)
Procedure Name: MAC Performed by: Valda Favia Pre-anesthesia Checklist: Patient identified, Emergency Drugs available, Suction available, Patient being monitored and Timeout performed Patient Re-evaluated:Patient Re-evaluated prior to inductionOxygen Delivery Method: Simple face mask Placement Confirmation: positive ETCO2 Dental Injury: Teeth and Oropharynx as per pre-operative assessment

## 2016-06-30 ENCOUNTER — Encounter (HOSPITAL_COMMUNITY): Payer: Self-pay | Admitting: Orthopedic Surgery

## 2016-06-30 DIAGNOSIS — M1711 Unilateral primary osteoarthritis, right knee: Secondary | ICD-10-CM | POA: Diagnosis not present

## 2016-06-30 DIAGNOSIS — Z96651 Presence of right artificial knee joint: Secondary | ICD-10-CM | POA: Diagnosis not present

## 2016-06-30 LAB — BASIC METABOLIC PANEL
ANION GAP: 8 (ref 5–15)
BUN: 15 mg/dL (ref 6–20)
CALCIUM: 8.7 mg/dL — AB (ref 8.9–10.3)
CO2: 26 mmol/L (ref 22–32)
Chloride: 107 mmol/L (ref 101–111)
Creatinine, Ser: 1.25 mg/dL — ABNORMAL HIGH (ref 0.61–1.24)
GFR, EST NON AFRICAN AMERICAN: 55 mL/min — AB (ref >60)
Glucose, Bld: 122 mg/dL — ABNORMAL HIGH (ref 65–99)
POTASSIUM: 4.5 mmol/L (ref 3.5–5.1)
Sodium: 141 mmol/L (ref 135–145)

## 2016-06-30 LAB — CBC
HEMATOCRIT: 34.6 % — AB (ref 39.0–52.0)
Hemoglobin: 12.2 g/dL — ABNORMAL LOW (ref 13.0–17.0)
MCH: 32.4 pg (ref 26.0–34.0)
MCHC: 35.3 g/dL (ref 30.0–36.0)
MCV: 92 fL (ref 78.0–100.0)
PLATELETS: 203 10*3/uL (ref 150–400)
RBC: 3.76 MIL/uL — AB (ref 4.22–5.81)
RDW: 13.2 % (ref 11.5–15.5)
WBC: 13.3 10*3/uL — AB (ref 4.0–10.5)

## 2016-06-30 MED ORDER — OXYCODONE HCL 10 MG PO TABS: 5.0000 mg | ORAL_TABLET | ORAL | 0 refills | Status: DC | PRN

## 2016-06-30 MED ORDER — ASPIRIN 325 MG PO TBEC: 325.0000 mg | DELAYED_RELEASE_TABLET | Freq: Two times a day (BID) | ORAL | 0 refills | Status: DC

## 2016-06-30 MED ORDER — METHOCARBAMOL 500 MG PO TABS: 500.0000 mg | ORAL_TABLET | Freq: Four times a day (QID) | ORAL | 0 refills | Status: DC | PRN

## 2016-06-30 NOTE — Evaluation (Signed)
Occupational Therapy Evaluation Patient Details Name: Marvin Ingram MRN: [id number] DOB: 05/24/1945 Today's Date: 06/30/2016    History of Present Illness Pt is a 71 yo male s/p L TKA on 4/23. PMH: basal cell carcinoma, bradycardia, cad, arthritis.   Clinical Impression   PTA, pt was living with his wife and was independent. Currently, pt performing ADLs and functional mobility at [facility] guard level. Provided pt with education on LB ADL techniques, toilet transfers, tub transfer, and precautions; pt demonstrated understanding. Answered all pt questions and met all acute OT needs. Recommend pt dc home once medically stable per physician. Will sign off. Thank you.     Follow Up Recommendations  No OT follow up;Supervision/Assistance - 24 hour    Equipment Recommendations  None recommended by OT    Recommendations for Other Services       Precautions / Restrictions Precautions Precautions: Knee;Fall Precaution Booklet Issued: Yes (comment) Precaution Comments: pt given handout, pt and spouse with verbal understanding Restrictions Weight Bearing Restrictions: Yes LLE Weight Bearing: Weight bearing as tolerated      Mobility Bed Mobility Overal bed mobility: Modified Independent Bed Mobility: Supine to Sit     Supine to sit: Modified independent (Device/Increase time)     General bed mobility comments: In bathroom upon arrival  Transfers Overall transfer level: Needs assistance Equipment used: Rolling walker (2 wheeled) Transfers: Sit to/from Stand Sit to Stand: Supervision         General transfer comment: Provided VCs for RW management and hand placement; pt demonstrates understanding    Balance Overall balance assessment: No apparent balance deficits (not formally assessed) Sitting-balance support: Feet supported;No upper extremity supported Sitting balance-Leahy Scale: Good     Standing balance support: No upper extremity supported;During functional  activity Standing balance-Leahy Scale: Fair Standing balance comment: Maintained balance during grooming at sink                           ADL either performed or assessed with clinical judgement   ADL Overall ADL's : Needs assistance/impaired Eating/Feeding: Set up;Sitting   Grooming: Wash/dry hands;Min guard;Standing   Upper Body Bathing: Sitting;Set up   Lower Body Bathing: Min guard;Sit to/from stand   Upper Body Dressing : Set up;Sitting Upper Body Dressing Details (indicate cue type and reason): Donned shirt Lower Body Dressing: Min guard;Sit to/from stand Lower Body Dressing Details (indicate cue type and reason): Donned underwear and pants. Educated pt on LB dressing techniques Toilet Transfer: Min Teaching laboratory technician and Hygiene: Min guard;Sit to/from stand   Tub/ Shower Transfer: Min guard;Tub bench;Ambulation;Rolling walker;Tub transfer Tub/Shower Transfer Details (indicate cue type and reason): Educated pt on tub bench transfer and he demonstrated good understanding Functional mobility during ADLs: Min Geophysical data processor     Praxis      Pertinent Vitals/Pain Pain Assessment: 0-10 Pain Score: 2  Pain Location: left knee Pain Descriptors / Indicators: Discomfort Pain Intervention(s): Limited activity within patient's tolerance;Monitored during session;Premedicated before session;Repositioned     Hand Dominance Right   Extremity/Trunk Assessment Upper Extremity Assessment Upper Extremity Assessment: Overall WFL for tasks assessed   Lower Extremity Assessment Lower Extremity Assessment: Defer to PT evaluation;LLE deficits/detail LLE Deficits / Details: Demonstrating good progress   Cervical / Trunk Assessment Cervical / Trunk Assessment: Normal   Communication Communication Communication: No difficulties   Cognition Arousal/Alertness: Awake/alert  Behavior During  Therapy: WFL for tasks assessed/performed Overall Cognitive Status: Within Functional Limits for tasks assessed                                     General Comments  Pt reports dizziness. never showed signs of distress or lose of balance    Exercises Total Joint Exercises Ankle Circles/Pumps: AROM;Strengthening;Both;10 reps;Supine Quad Sets: AROM;Strengthening;Right;10 reps;Supine Heel Slides: AROM;Strengthening;Right;10 reps;Supine Hip ABduction/ADduction: AROM;Strengthening;Right;10 reps;Supine Straight Leg Raises: AROM;Strengthening;Right;10 reps;Supine Long Arc Quad: AROM;10 reps;Seated Knee Flexion: AAROM;Strengthening;Right;10 reps;Seated Goniometric ROM: 100*   Shoulder Instructions      Home Living Family/patient expects to be discharged to:: Private residence Living Arrangements: Spouse/significant other Available Help at Discharge: Family;Available 24 hours/day Type of Home: House Home Access: Stairs to enter [employer] of Steps: 5 Entrance Stairs-Rails: Can reach both Home Layout: One level     Bathroom Shower/Tub: Teacher, early years/pre: Handicapped height     Home Equipment: Environmental consultant - 2 wheels;Bedside commode;Tub bench   Additional Comments: cpm      Prior Functioning/Environment Level of Independence: Independent                 OT Problem List: Decreased strength;Decreased range of motion;Decreased activity tolerance;Impaired balance (sitting and/or standing);Decreased knowledge of use of DME or AE;Decreased knowledge of precautions;Pain      OT Treatment/Interventions:      OT Goals(Current goals can be found in the care plan section) Acute Rehab OT Goals Patient Stated Goal: Go home OT Goal Formulation: With patient Time For Goal Achievement: 07/14/16 Potential to Achieve Goals: Good  OT Frequency:     Barriers to D/C:            Co-evaluation              End of Session Equipment  Utilized During Treatment: Gait belt;Rolling walker Nurse Communication: Mobility status  Activity Tolerance: Patient tolerated treatment well Patient left: Other (comment) (with PT in ortho gym)  OT Visit Diagnosis: Unsteadiness on feet (R26.81);Muscle weakness (generalized) (M62.81);Pain Pain - Right/Left: Left Pain - part of body: Knee                Time: 0272-5366 OT Time Calculation (min): 22 min Charges:  OT General Charges $OT Visit: 1 Procedure OT Evaluation $OT Eval Low Complexity: 1 Procedure G-Codes: OT G-codes **NOT FOR INPATIENT CLASS** Functional Assessment Tool Used: Clinical judgement Functional Limitation: Self care Self Care Current Status (Y4034): At least 1 percent but less than 20 percent impaired, limited or restricted Self Care Goal Status (V4259): At least 1 percent but less than 20 percent impaired, limited or restricted Self Care Discharge Status [phone]): At least 1 percent but less than 20 percent impaired, limited or restricted   [facility], OTR/L [place] 06/30/2016, 12:34 PM

## 2016-06-30 NOTE — Op Note (Signed)
TOTAL KNEE REPLACEMENT OPERATIVE NOTE:  06/29/2016  5:23 PM  PATIENT:  Marvin Ingram  71 y.o. male  PRE-OPERATIVE DIAGNOSIS:  primary osteoarthritis right knee  POST-OPERATIVE DIAGNOSIS:  primary osteoarthritis right knee  PROCEDURE:  Procedure(s): TOTAL KNEE ARTHROPLASTY  SURGEON:  Surgeon(s): [name], MD  PHYSICIAN ASSISTANT:  Nehemiah Massed, [facility]  ANESTHESIA:   spinal  DRAINS: Hemovac  SPECIMEN: None  COUNTS:  Correct  TOURNIQUET:   Total Tourniquet Time Documented: Thigh (Right) - 43 minutes Total: Thigh (Right) - 43 minutes   DICTATION:  Indication for procedure:    The patient is a 71 y.o. male who has failed conservative treatment for primary osteoarthritis right knee.  Informed consent was obtained prior to anesthesia. The risks versus benefits of the operation were explain and in a way the patient can, and did, understand.   On the implant demand matching protocol, this patient scored 8.  Therefore, this patient was not receive a polyethylene insert with vitamin E which is a high demand implant.  Description of procedure:     The patient was taken to the operating room and placed under anesthesia.  The patient was positioned in the usual fashion taking care that all body parts were adequately padded and/or protected.  I foley catheter was not placed.  A tourniquet was applied and the leg prepped and draped in the usual sterile fashion.  The extremity was exsanguinated with the esmarch and tourniquet inflated to 350 mmHg.  Pre-operative range of motion was normal.  The knee was in 5 degree of mild valgus.  A midline incision approximately 6-7 inches long was made with a #10 blade.  A new blade was used to make a parapatellar arthrotomy going 2-3 cm into the quadriceps tendon, over the patella, and alongside the medial aspect of the patellar tendon.  A synovectomy was then performed with the #10 blade and forceps. I then elevated the deep MCL off the medial  tibial metaphysis subperiosteally around to the semimembranosus attachment.    I everted the patella and used calipers to measure patellar thickness.  I used the reamer to ream down to appropriate thickness to recreate the native thickness.  I then removed excess bone with the rongeur and sagittal saw.  I used the appropriately sized template and drilled the three lug holes.  I then put the trial in place and measured the thickness with the calipers to ensure recreation of the native thickness.  The trial was then removed and the patella subluxed and the knee brought into flexion.  A homan retractor was place to retract and protect the patella and lateral structures.  A Z-retractor was place medially to protect the medial structures.  The extra-medullary alignment system was used to make cut the tibial articular surface perpendicular to the anamotic axis of the tibia and in 3 degrees of posterior slope.  The cut surface and alignment jig was removed.  I then used the intramedullary alignment guide to make a 4 valgus cut on the distal femur.  I then marked out the epicondylar axis on the distal femur.  The posterior condylar axis measured 3 degrees.  I then used the anterior referencing sizer and measured the femur to be a size 10.  The 4-In-1 cutting block was screwed into place in external rotation matching the posterior condylar angle, making our cuts perpendicular to the epicondylar axis.  Anterior, posterior and chamfer cuts were made with the sagittal saw.  The cutting block and cut pieces  were removed.  A lamina spreader was placed in 90 degrees of flexion.  The ACL, PCL, menisci, and posterior condylar osteophytes were removed.  A 10 mm spacer blocked was found to offer good flexion and extension gap balance after minimal in degree releasing.   The scoop retractor was then placed and the femoral finishing block was pinned in place.  The small sagittal saw was used as well as the lug drill to finish  the femur.  The block and cut surfaces were removed and the medullary canal hole filled with autograft bone from the cut pieces.  The tibia was delivered forward in deep flexion and external rotation.  A size G tray was selected and pinned into place centered on the medial 1/3 of the tibial tubercle.  The reamer and keel was used to prepare the tibia through the tray.    I then trialed with the size 10 femur, size G tibia, a 10 mm insert and the 35 patella.  I had excellent flexion/extension gap balance, excellent patella tracking.  Flexion was full and beyond 120 degrees; extension was zero.  These components were chosen and the staff opened them to me on the back table while the knee was lavaged copiously and the cement mixed.  The soft tissue was infiltrated with 60cc of exparel 1.3% through a 21 gauge needle.  I cemented in the components and removed all excess cement.  The polyethylene tibial component was snapped into place and the knee placed in extension while cement was hardening.  The capsule was infilltrated with 30cc of .25% Marcaine with epinephrine.  A hemovac was place in the joint exiting superolaterally.  A pain pump was place superomedially superficial to the arthrotomy.  Once the cement was hard, the tourniquet was let down.  Hemostasis was obtained.  The arthrotomy was closed with figure-8 #1 vicryl sutures.  The deep soft tissues were closed with #0 vicryls and the subcuticular layer closed with a running #2-0 vicryl.  The skin was reapproximated and closed with skin staples.  The wound was dressed with xeroform, 4 x4's, 2 ABD sponges, a single layer of webril and a TED stocking.   The patient was then awakened, extubated, and taken to the recovery room in stable condition.  BLOOD LOSS:  300cc DRAINS: 1 hemovac, 1 pain catheter COMPLICATIONS:  None.  PLAN OF CARE: Admit to inpatient   PATIENT DISPOSITION:  PACU - hemodynamically stable.   Delay start of Pharmacological VTE  agent (>24hrs) due to surgical blood loss or risk of bleeding:  not applicable  Please fax a copy of this op note to my office at [phone] (please only include page 1 and 2 of the Case Information op note)

## 2016-06-30 NOTE — Discharge Summary (Signed)
SPORTS MEDICINE & JOINT REPLACEMENT   Lara Mulch, MD   Carlyon Shadow, PA-C [place] [place], [place], [place]  [address]                             [phone]  PATIENT ID: Marvin Ingram        MRN:  [id number]          DOB/AGE: [birth year] / 71 y.o.    DISCHARGE SUMMARY  ADMISSION DATE:    06/29/2016 DISCHARGE DATE:   06/30/2016   ADMISSION DIAGNOSIS: primary osteoarthritis right knee    DISCHARGE DIAGNOSIS:  primary osteoarthritis right knee    ADDITIONAL DIAGNOSIS: Active Problems:   S/P total knee replacement  Past Medical History:  Diagnosis Date  . Arthritis   . Basal cell carcinoma   . Bradycardia    relative  . CAD (coronary artery disease)   . Heart disease   . Hyperlipidemia   . Myocardial infarction [facility]) 2007   has 2 stents  . PONV (postoperative nausea and vomiting)    problems yrears ago in 1963  . Seborrheic keratoses     PROCEDURE: Procedure(s): TOTAL KNEE ARTHROPLASTY on 06/29/2016  CONSULTS:    HISTORY:  See H&P in chart  HOSPITAL COURSE:  Marvin Ingram is a 71 y.o. admitted on 06/29/2016 and found to have a diagnosis of primary osteoarthritis right knee.  After appropriate laboratory studies were obtained  they were taken to the operating room on 06/29/2016 and underwent Procedure(s): TOTAL KNEE ARTHROPLASTY.   They were given perioperative antibiotics:  Anti-infectives    Start     Dose/Rate Route Frequency Ordered Stop   06/29/16 1500  ceFAZolin (ANCEF) IVPB 1 g/50 mL premix     1 g 100 mL/hr over 30 Minutes Intravenous Every 6 hours 06/29/16 1343 06/29/16 2305   06/29/16 0641  ceFAZolin (ANCEF) IVPB 2g/100 mL premix     2 g 200 mL/hr over 30 Minutes Intravenous On call to O.R. 06/29/16 9767 06/29/16 0857    .  Patient given tranexamic acid IV or topical and exparel intra-operatively.  Tolerated the procedure well.    POD# 1: Vital signs were stable.  Patient denied Chest pain, shortness of breath, or calf pain.  Patient was  started on Lovenox 30 mg subcutaneously twice daily at 8am.  Consults to PT, OT, and care management were made.  The patient was weight bearing as tolerated.  CPM was placed on the operative leg 0-90 degrees for 6-8 hours a day. When out of the CPM, patient was placed in the foam block to achieve full extension. Incentive spirometry was taught.  Dressing was changed.       POD #2, Continued  PT for ambulation and exercise program.  IV saline locked.  O2 discontinued.    The remainder of the hospital course was dedicated to ambulation and strengthening.   The patient was discharged on 1 Day Post-Op in  Good condition.  Blood products given:none  DIAGNOSTIC STUDIES: Recent vital signs: Patient Vitals for the past 24 hrs:  BP Temp Temp src Pulse Resp SpO2  06/30/16 0440 116/64 98.2 F (36.8 C) Oral (!) 53 16 98 %  06/29/16 2358 108/63 98.3 F (36.8 C) Oral (!) 58 16 98 %  06/29/16 2118 117/65 97.7 F (36.5 C) Oral 64 14 100 %  06/29/16 1330 (!) 141/72 97.6 F (36.4 C) Oral 61 - 100 %  06/29/16  1245 (!) 144/74 97.5 F (36.4 C) - (!) 59 10 100 %  06/29/16 1230 (!) 148/79 - - (!) 58 12 100 %       Recent laboratory studies:  Recent Labs  06/30/16 0511  WBC 13.3*  HGB 12.2*  HCT 34.6*  PLT 203    Recent Labs  06/30/16 0511  NA 141  K 4.5  CL 107  CO2 26  BUN 15  CREATININE 1.25*  GLUCOSE 122*  CALCIUM 8.7*   No results found for: INR, PROTIME   Recent Radiographic Studies :  No results found.  DISCHARGE INSTRUCTIONS: Discharge Instructions    CPM    Complete by:  As directed    Continuous passive motion machine (CPM):      Use the CPM from 0 to 90 for 4-6 hours per day.      You may increase by 10 per day.  You may break it up into 2 or 3 sessions per day.      Use CPM for 2 weeks or until you are told to stop.   Call MD / Call 911    Complete by:  As directed    If you experience chest pain or shortness of breath, CALL 911 and be transported to the hospital  emergency room.  If you develope a fever above 101 F, pus (white drainage) or increased drainage or redness at the wound, or calf pain, call your surgeon's office.   Constipation Prevention    Complete by:  As directed    Drink plenty of fluids.  Prune juice may be helpful.  You may use a stool softener, such as Colace (over the counter) 100 mg twice a day.  Use MiraLax (over the counter) for constipation as needed.   Diet - low sodium heart healthy    Complete by:  As directed    Discharge instructions    Complete by:  As directed    INSTRUCTIONS AFTER JOINT REPLACEMENT   Remove items at home which could result in a fall. This includes throw rugs or furniture in walking pathways ICE to the affected joint every three hours while awake for 30 minutes at a time, for at least the first 3-5 days, and then as needed for pain and swelling.  Continue to use ice for pain and swelling. You may notice swelling that will progress down to the foot and ankle.  This is normal after surgery.  Elevate your leg when you are not up walking on it.   Continue to use the breathing machine you got in the hospital (incentive spirometer) which will help keep your temperature down.  It is common for your temperature to cycle up and down following surgery, especially at night when you are not up moving around and exerting yourself.  The breathing machine keeps your lungs expanded and your temperature down.   DIET:  As you were doing prior to hospitalization, we recommend a well-balanced diet.  DRESSING / WOUND CARE / SHOWERING  Keep the surgical dressing until follow up.  The dressing is water proof, so you can shower without any extra covering.  IF THE DRESSING FALLS OFF or the wound gets wet inside, change the dressing with sterile gauze.  Please use good hand washing techniques before changing the dressing.  Do not use any lotions or creams on the incision until instructed by your surgeon.    ACTIVITY  Increase  activity slowly as tolerated, but follow the weight bearing instructions  below.   No driving for 6 weeks or until further direction given by your physician.  You cannot drive while taking narcotics.  No lifting or carrying greater than 10 lbs. until further directed by your surgeon. Avoid periods of inactivity such as sitting longer than an hour when not asleep. This helps prevent blood clots.  You may return to work once you are authorized by your doctor.     WEIGHT BEARING   Weight bearing as tolerated with assist device (walker, cane, etc) as directed, use it as long as suggested by your surgeon or therapist, typically at least 4-6 weeks.   EXERCISES  Results after joint replacement surgery are often greatly improved when you follow the exercise, range of motion and muscle strengthening exercises prescribed by your doctor. Safety measures are also important to protect the joint from further injury. Any time any of these exercises cause you to have increased pain or swelling, decrease what you are doing until you are comfortable again and then slowly increase them. If you have problems or questions, call your caregiver or physical therapist for advice.   Rehabilitation is important following a joint replacement. After just a few days of immobilization, the muscles of the leg can become weakened and shrink (atrophy).  These exercises are designed to build up the tone and strength of the thigh and leg muscles and to improve motion. Often times heat used for twenty to thirty minutes before working out will loosen up your tissues and help with improving the range of motion but do not use heat for the first two weeks following surgery (sometimes heat can increase post-operative swelling).   These exercises can be done on a training (exercise) mat, on the floor, on a table or on a bed. Use whatever works the best and is most comfortable for you.    Use music or television while you are exercising so  that the exercises are a pleasant break in your day. This will make your life better with the exercises acting as a break in your routine that you can look forward to.   Perform all exercises about fifteen times, three times per day or as directed.  You should exercise both the operative leg and the other leg as well.   Exercises include:   Quad Sets - Tighten up the muscle on the front of the thigh (Quad) and hold for 5-10 seconds.   Straight Leg Raises - With your knee straight (if you were given a brace, keep it on), lift the leg to 60 degrees, hold for 3 seconds, and slowly lower the leg.  Perform this exercise against resistance later as your leg gets stronger.  Leg Slides: Lying on your back, slowly slide your foot toward your buttocks, bending your knee up off the floor (only go as far as is comfortable). Then slowly slide your foot back down until your leg is flat on the floor again.  Angel Wings: Lying on your back spread your legs to the side as far apart as you can without causing discomfort.  Hamstring Strength:  Lying on your back, push your heel against the floor with your leg straight by tightening up the muscles of your buttocks.  Repeat, but this time bend your knee to a comfortable angle, and push your heel against the floor.  You may put a pillow under the heel to make it more comfortable if necessary.   A rehabilitation program following joint replacement surgery can speed recovery and  prevent re-injury in the future due to weakened muscles. Contact your doctor or a physical therapist for more information on knee rehabilitation.    CONSTIPATION  Constipation is defined medically as fewer than three stools per week and severe constipation as less than one stool per week.  Even if you have a regular bowel pattern at home, your normal regimen is likely to be disrupted due to multiple reasons following surgery.  Combination of anesthesia, postoperative narcotics, change in appetite and  fluid intake all can affect your bowels.   YOU MUST use at least one of the following options; they are listed in order of increasing strength to get the job done.  They are all available over the counter, and you may need to use some, POSSIBLY even all of these options:    Drink plenty of fluids (prune juice may be helpful) and high fiber foods Colace 100 mg by mouth twice a day  Senokot for constipation as directed and as needed Dulcolax (bisacodyl), take with full glass of water  Miralax (polyethylene glycol) once or twice a day as needed.  If you have tried all these things and are unable to have a bowel movement in the first 3-4 days after surgery call either your surgeon or your primary doctor.    If you experience loose stools or diarrhea, hold the medications until you stool forms back up.  If your symptoms do not get better within 1 week or if they get worse, check with your doctor.  If you experience "the worst abdominal pain ever" or develop nausea or vomiting, please contact the office immediately for further recommendations for treatment.   ITCHING:  If you experience itching with your medications, try taking only a single pain pill, or even half a pain pill at a time.  You can also use Benadryl over the counter for itching or also to help with sleep.   TED HOSE STOCKINGS:  Use stockings on both legs until for at least 2 weeks or as directed by physician office. They may be removed at night for sleeping.  MEDICATIONS:  See your medication summary on the "After Visit Summary" that nursing will review with you.  You may have some home medications which will be placed on hold until you complete the course of blood thinner medication.  It is important for you to complete the blood thinner medication as prescribed.  PRECAUTIONS:  If you experience chest pain or shortness of breath - call 911 immediately for transfer to the hospital emergency department.   If you develop a fever greater  that 101 F, purulent drainage from wound, increased redness or drainage from wound, foul odor from the wound/dressing, or calf pain - CONTACT YOUR SURGEON.                                                   FOLLOW-UP APPOINTMENTS:  If you do not already have a post-op appointment, please call the office for an appointment to be seen by your surgeon.  Guidelines for how soon to be seen are listed in your "After Visit Summary", but are typically between 1-4 weeks after surgery.  OTHER INSTRUCTIONS:   Knee Replacement:  Do not place pillow under knee, focus on keeping the knee straight while resting. CPM instructions: 0-90 degrees, 2 hours in the morning, 2 hours  in the afternoon, and 2 hours in the evening. Place foam block, curve side up under heel at all times except when in CPM or when walking.  DO NOT modify, tear, cut, or change the foam block in any way.  MAKE SURE YOU:  Understand these instructions.  Get help right away if you are not doing well or get worse.    Thank you for letting us be a part of your medical care team.  It is a privilege we respect greatly.  We hope these instructions will help you stay on track for a fast and full recovery!   Increase activity slowly as tolerated    Complete by:  As directed       DISCHARGE MEDICATIONS:   Allergies as of 06/30/2016      Reactions   No Known Allergies       Medication List    STOP taking these medications   aspirin 81 MG tablet Replaced by:  aspirin 325 MG EC tablet     TAKE these medications   acetaminophen 500 MG tablet Commonly known as:  TYLENOL Take 500 mg by mouth every 6 (six) hours as needed (for pain/headache).   aspirin 325 MG EC tablet Take 1 tablet (325 mg total) by mouth 2 (two) times daily. Replaces:  aspirin 81 MG tablet   atorvastatin 10 MG tablet Commonly known as:  LIPITOR TAKE 1/2 TABLET BY MOUTH EVERY DAY What changed:  See the new instructions.   Fish Oil 1200 MG Caps Take 1,200 mg by mouth  daily.   methocarbamol 500 MG tablet Commonly known as:  ROBAXIN Take 1-2 tablets (500-1,000 mg total) by mouth every 6 (six) hours as needed for muscle spasms.   Oxycodone HCl 10 MG Tabs Take 0.5-1 tablets (5-10 mg total) by mouth every 3 (three) hours as needed for breakthrough pain.   SYSTANE 0.4-0.3 % Soln Generic drug:  Polyethyl Glycol-Propyl Glycol Place 1 drop into both eyes 2 (two) times daily.            Durable Medical Equipment        Start     Ordered   06/29/16 1343  DME Walker rolling  Once    Question:  Patient needs a walker to treat with the following condition  Answer:  S/P total knee replacement   06/29/16 1343   06/29/16 1343  DME 3 n 1  Once     06/29/16 1343   06/29/16 1343  DME Bedside commode  Once    Question:  Patient needs a bedside commode to treat with the following condition  Answer:  S/P total knee replacement   06/29/16 1343      FOLLOW UP VISIT:    DISPOSITION: HOME VS. SNF  CONDITION:  Good   Donia Ast 06/30/2016, 12:27 PM

## 2016-06-30 NOTE — Care Management Obs Status (Signed)
[facility] NOTIFICATION   Patient Details  Name: Marvin Ingram MRN: [id number] Date of Birth: [birth year]   Medicare Observation Status Notification Given:  Yes    Carles Collet, RN 06/30/2016, 10:51 AM

## 2016-06-30 NOTE — Progress Notes (Signed)
Discharged to home . Left unit via wheelchair accompanied by spouse and Psychologist, occupational. Verbalized understanding of discharge packet, teach back done, IV removed with catheter intact. New Prescriptions given and explained.

## 2016-06-30 NOTE — Progress Notes (Signed)
Physical Therapy Treatment Patient Details Name: Marvin Ingram MRN: [id number] DOB: 17-Jan-1946 Today's Date: 06/30/2016    History of Present Illness (P) Pt is a 71 yo male s/p L TKA on 4/23. PMH: basal cell carcinoma, bradycardia, cad, arthritis.    PT Comments    Pt demonstrated some decreased safety awareness with transfers this afternoon. Discussed proper technique with pt and his wife. Both are agreeable to my recommendations and demonstrated good technique the rest of the session. Pt's wife assist pt with stairs and demonstrated safe technique. Pt is ready for d/c home this afternoon with follow-up HHPT.  Follow Up Recommendations  Home health PT     Equipment Recommendations  Rolling walker with 5" wheels    Recommendations for Other Services       Precautions / Restrictions Precautions Precautions: (P) Knee;Fall Precaution Booklet Issued: (P) Yes (comment) Precaution Comments: (P) pt given handout, pt and spouse with verbal understanding Restrictions Weight Bearing Restrictions: (P) Yes LLE Weight Bearing: (P) Weight bearing as tolerated    Mobility  Bed Mobility Overal bed mobility: (P) Modified Independent Bed Mobility: (P) Supine to Sit     Supine to sit: (P) Modified independent (Device/Increase time)     General bed mobility comments: (P) verbal directional cues for long sit technique, minA for LE mangement, used bed rail  Transfers Overall transfer level: Needs assistance (Simultaneous filing. User may not have seen previous data.) Equipment used: Rolling walker (2 wheeled) (Simultaneous filing. User may not have seen previous data.) Transfers: Sit to/from Stand (Simultaneous filing. User may not have seen previous data.) Sit to Stand: Supervision (Simultaneous filing. User may not have seen previous data.) Stand pivot transfers: (P) Min assist;Mod assist       General transfer comment: Pt's wife was present so I allowed the wife to assist. Pt  attempted to stand from the recliner without putting in the leg rest and also triend to stand without the walker in front of him. Pt attempted to stand placing both hands on the RW and was unsafe. I reinforced technique with transfer to the patient and his wife. Both verbally agree on technique and demonstrated safe technique throughout the rest of the session. (Simultaneous filing. User may not have seen previous data.)  Ambulation/Gait Ambulation/Gait assistance: Supervision Ambulation Distance (Feet): 200 Feet Assistive device: Rolling walker (2 wheeled) Gait Pattern/deviations: Step-through pattern;Decreased stride length         Stairs Stairs: Yes   Stair Management: Two rails;Forwards Number of Stairs: 6 General stair comments: pt's wife assisted  Wheelchair Mobility    Modified Rankin (Stroke Patients Only)       Balance Overall balance assessment: No apparent balance deficits (not formally assessed) Sitting-balance support: (P) Feet supported;Bilateral upper extremity supported Sitting balance-Leahy Scale: (P) Fair     Standing balance support: (P) Bilateral upper extremity supported Standing balance-Leahy Scale: (P) Fair Standing balance comment: (P) requires RW for safe standing                            Cognition Arousal/Alertness: Awake/alert Behavior During Therapy: WFL for tasks assessed/performed Overall Cognitive Status: Within Functional Limits for tasks assessed                                        Exercises Total Joint Exercises Ankle Circles/Pumps: (P) AROM;Strengthening;Both;10 reps;Supine Quad Sets: (  P) AROM;Strengthening;Right;10 reps;Supine Heel Slides: (P) AROM;Strengthening;Right;10 reps;Supine Hip ABduction/ADduction: (P) AROM;Strengthening;Right;10 reps;Supine Straight Leg Raises: (P) AROM;Strengthening;Right;10 reps;Supine Long Arc Quad: (P) AROM;10 reps;Seated Knee Flexion: (P) AAROM;Strengthening;Right;10  reps;Seated Goniometric ROM: 100*    General Comments        Pertinent Vitals/Pain Pain Assessment: (P) 0-10 Pain Score: 2  Pain Location: left knee Pain Descriptors / Indicators: Discomfort Pain Intervention(s): Limited activity within patient's tolerance;Monitored during session;Premedicated before session;Repositioned    Home Living Family/patient expects to be discharged to:: (P) Private residence Living Arrangements: (P) Spouse/significant other Available Help at Discharge: (P) Family;Available 24 hours/day Type of Home: (P) House Home Access: (P) Stairs to enter Entrance Stairs-Rails: (P) Can reach both Home Layout: (P) One level Home Equipment: (P) Walker - 2 wheels;Bedside commode;Tub bench Additional Comments: (P) cpm    Prior Function Level of Independence: (P) Independent          PT Goals (current goals can now be found in the care plan section) Acute Rehab PT Goals Patient Stated Goal: (P) home tomorrow Progress towards PT goals: Progressing toward goals    Frequency    7X/week      PT Plan Current plan remains appropriate    Co-evaluation             End of Session Equipment Utilized During Treatment: Gait belt Activity Tolerance: Patient tolerated treatment well Patient left: in chair;with call bell/phone within reach;with family/visitor present Nurse Communication: Mobility status PT Visit Diagnosis: Muscle weakness (generalized) (M62.81);Difficulty in walking, not elsewhere classified (R26.2)     Time: 4270-6237 PT Time Calculation (min) (ACUTE ONLY): 25 min  Charges:  $Gait Training: 8-22 mins $Therapeutic Exercise: 8-22 mins $Therapeutic Activity: 8-22 mins                    G Codes:  Functional Assessment Tool Used: Clinical judgement Functional Limitation: Mobility: Walking and moving around Mobility: Walking and Moving Around Current Status (S2831): At least 1 percent but less than 20 percent impaired, limited or  restricted Mobility: Walking and Moving Around Goal Status [phone]): At least 1 percent but less than 20 percent impaired, limited or restricted Mobility: Walking and Moving Around Discharge Status [phone]): At least 1 percent but less than 20 percent impaired, limited or restricted    [place], [place] PT   Jihaad Bruschi Kerstine 06/30/2016, 12:23 PM

## 2016-06-30 NOTE — Care Management Note (Signed)
Case Management Note  Patient Details  Name: MARKOS THEIL MRN: [id number] Date of Birth: [birth year]  Subjective/Objective:                 Spoke with patient and wife in room, verified DME delivered to home pta, patient will follow up with [facility] through [facility]. Patient in obs for knee surgery.   Action/Plan:  No other CM needs identified. DC to home w [facility].  Expected Discharge Date:                  Expected Discharge Plan:  [place]  In-House Referral:     Discharge planning Services  CM Consult  Post Acute Care Choice:  Home Health, Durable Medical Equipment Choice offered to:  Patient, Spouse  DME Arranged:   (3in 1 and RW already delivered to home, verified with patient) DME Agency:     HH Arranged:  PT [place]:  [facility] (now [facility])  Status of Service:  Completed, signed off  If discussed at [employer] of Stay Meetings, dates discussed:    Additional Comments:  Carles Collet, RN 06/30/2016, 11:39 AM

## 2016-06-30 NOTE — Progress Notes (Signed)
SPORTS MEDICINE AND JOINT REPLACEMENT  Lara Mulch, MD    Carlyon Shadow, PA-C [place], [place], [place]  [address]                             [phone]   PROGRESS NOTE  Subjective:  negative for Chest Pain  negative for Shortness of Breath  negative for Nausea/Vomiting   negative for Calf Pain  negative for Bowel Movement   Tolerating Diet: yes         Patient reports pain as 4 on 0-10 scale.    Objective: Vital signs in last 24 hours:   Patient Vitals for the past 24 hrs:  BP Temp Temp src Pulse Resp SpO2  06/30/16 0440 116/64 98.2 F (36.8 C) Oral (!) 53 16 98 %  06/29/16 2358 108/63 98.3 F (36.8 C) Oral (!) 58 16 98 %  06/29/16 2118 117/65 97.7 F (36.5 C) Oral 64 14 100 %  06/29/16 1330 (!) 141/72 97.6 F (36.4 C) Oral 61 - 100 %  06/29/16 1245 (!) 144/74 97.5 F (36.4 C) - (!) 59 10 100 %  06/29/16 1230 (!) 148/79 - - (!) 58 12 100 %  06/29/16 1215 136/74 - - (!) 56 10 100 %  06/29/16 1200 (!) 141/77 - - 60 10 100 %  06/29/16 1150 - - - 62 11 100 %  06/29/16 1145 140/70 - - (!) 56 16 100 %  06/29/16 1140 - - - (!) 57 11 100 %  06/29/16 1130 138/72 - - (!) 57 11 100 %  06/29/16 1115 132/73 - - (!) 57 12 100 %  06/29/16 1100 124/71 - - (!) 57 14 100 %  06/29/16 1045 111/68 - - 60 14 100 %  06/29/16 1030 102/62 - - 63 17 100 %  06/29/16 1027 - 97.4 F (36.3 C) - 64 11 100 %  06/29/16 0800 120/64 - - (!) 59 (!) 26 100 %  06/29/16 0755 132/65 - - (!) 57 11 100 %  06/29/16 0750 (!) 148/76 - - (!) 59 11 100 %  06/29/16 0745 138/71 - - (!) 55 12 100 %    @flow {1959:LAST@   Intake/Output from previous day:   04/23 0701 - 04/24 0700 In: 2186.3 [P.O.:240; I.V.:1946.3] Out: 980 [Urine:950]   Intake/Output this shift:   No intake/output data recorded.   Intake/Output      04/23 0701 - 04/24 0700 04/24 0701 - 04/25 0700   P.O. 240    I.V. 1946.3    Total Intake 2186.3     Urine 950    Blood 30    Total Output 980     Net +1206.3           Urine Occurrence 1 x       LABORATORY DATA:  Recent Labs  06/30/16 0511  WBC 13.3*  HGB 12.2*  HCT 34.6*  PLT 203    Recent Labs  06/30/16 0511  NA 141  K 4.5  CL 107  CO2 26  BUN 15  CREATININE 1.25*  GLUCOSE 122*  CALCIUM 8.7*   No results found for: INR, PROTIME  Examination:  General appearance: alert, cooperative and no distress Extremities: extremities normal, atraumatic, no cyanosis or edema  Wound Exam: clean, dry, intact   Drainage:  None: wound tissue dry  Motor Exam: Quadriceps and Hamstrings Intact  Sensory Exam: Superficial Peroneal, Deep Peroneal and Tibial  normal   Assessment:    1 Day Post-Op  Procedure(s) (LRB): TOTAL KNEE ARTHROPLASTY (Right)  ADDITIONAL DIAGNOSIS:  Active Problems:   S/P total knee replacement     Plan: Physical Therapy as ordered Weight Bearing as Tolerated (WBAT)  DVT Prophylaxis:  Aspirin  DISCHARGE PLAN: Home  DISCHARGE NEEDS: HHPT   Patient doing well, expected D/C today once cleared by PT         Donia Ast 06/30/2016, 7:18 AM

## 2016-06-30 NOTE — Progress Notes (Signed)
Physical Therapy Treatment Patient Details Name: Marvin Ingram MRN: [id number] DOB: 08/25/1945 Today's Date: 06/30/2016    History of Present Illness Pt is a 71 yo male s/p L TKA on 4/23. PMH: basal cell carcinoma, bradycardia, cad, arthritis.    PT Comments    Pt made excellent progress today with mobility. Pt completed step training with Supervision and is ambulating with RW supervision 200+ feet. Pt will have assistance from his spouse at d/c. Recommend d/c home today after BID therapy treatment.   Follow Up Recommendations  Home health PT     Equipment Recommendations  Rolling walker with 5" wheels    Recommendations for Other Services       Precautions / Restrictions Precautions Precautions: Knee Restrictions Weight Bearing Restrictions: Yes LLE Weight Bearing: Weight bearing as tolerated    Mobility  Bed Mobility Overal bed mobility: Modified Independent Bed Mobility: Supine to Sit     Supine to sit: Modified independent (Device/Increase time)        Transfers Overall transfer level: Needs assistance Equipment used: Rolling walker (2 wheeled) Transfers: Sit to/from Stand Sit to Stand: Supervision         General transfer comment: cues for hand placement  Ambulation/Gait Ambulation/Gait assistance: Supervision Ambulation Distance (Feet): 150 Feet Assistive device: Rolling walker (2 wheeled) Gait Pattern/deviations: Step-through pattern;Decreased stride length         Stairs Stairs: Yes   Stair Management: Two rails;Forwards Number of Stairs: 6    Wheelchair Mobility    Modified Rankin (Stroke Patients Only)       Balance Overall balance assessment: No apparent balance deficits (not formally assessed)                                          Cognition Arousal/Alertness: Awake/alert Behavior During Therapy: WFL for tasks assessed/performed Overall Cognitive Status: Within Functional Limits for tasks assessed                                        Exercises Total Joint Exercises Ankle Circles/Pumps: AROM;Strengthening;Both;10 reps;Supine Quad Sets: AROM;Strengthening;Right;10 reps;Supine Heel Slides: AROM;Strengthening;Right;10 reps;Supine Hip ABduction/ADduction: AROM;Strengthening;Right;10 reps;Supine Straight Leg Raises: AROM;Strengthening;Right;10 reps;Supine Long Arc Quad: AROM;10 reps;Seated Knee Flexion: AAROM;Strengthening;Right;10 reps;Seated Goniometric ROM: 100*    General Comments        Pertinent Vitals/Pain Pain Assessment: 0-10 Pain Score: 2  Pain Location: Left knee Pain Descriptors / Indicators: Discomfort Pain Intervention(s): Limited activity within patient's tolerance;Monitored during session    Home Living                      Prior Function            PT Goals (current goals can now be found in the care plan section) Progress towards PT goals: Progressing toward goals    Frequency    7X/week      PT Plan Current plan remains appropriate    Co-evaluation             End of Session Equipment Utilized During Treatment: Gait belt Activity Tolerance: Patient tolerated treatment well Patient left: in chair;with call bell/phone within reach Nurse Communication: Mobility status PT Visit Diagnosis: Muscle weakness (generalized) (M62.81);Difficulty in walking, not elsewhere classified (R26.2)     Time: 9767-3419 PT Time Calculation (  min) (ACUTE ONLY): 25 min  Charges:  $Gait Training: 8-22 mins $Therapeutic Exercise: 8-22 mins                    G Codes:  Functional Assessment Tool Used: Clinical judgement Functional Limitation: Mobility: Walking and moving around Mobility: Walking and Moving Around Current Status [phone]): At least 1 percent but less than 20 percent impaired, limited or restricted Mobility: Walking and Moving Around Goal Status [phone]): At least 1 percent but less than 20 percent impaired, limited or  restricted    [place], Marvin Ingram PT   Marvin Ingram 06/30/2016, 9:18 AM

## 2016-07-01 DIAGNOSIS — Z96651 Presence of right artificial knee joint: Secondary | ICD-10-CM | POA: Diagnosis not present

## 2016-07-01 DIAGNOSIS — Z79891 Long term (current) use of opiate analgesic: Secondary | ICD-10-CM | POA: Diagnosis not present

## 2016-07-01 DIAGNOSIS — Z85828 Personal history of other malignant neoplasm of skin: Secondary | ICD-10-CM | POA: Diagnosis not present

## 2016-07-01 DIAGNOSIS — Z7982 Long term (current) use of aspirin: Secondary | ICD-10-CM | POA: Diagnosis not present

## 2016-07-01 DIAGNOSIS — E785 Hyperlipidemia, unspecified: Secondary | ICD-10-CM | POA: Diagnosis not present

## 2016-07-01 DIAGNOSIS — Z471 Aftercare following joint replacement surgery: Secondary | ICD-10-CM | POA: Diagnosis not present

## 2016-07-01 DIAGNOSIS — I2510 Atherosclerotic heart disease of native coronary artery without angina pectoris: Secondary | ICD-10-CM | POA: Diagnosis not present

## 2016-07-07 DIAGNOSIS — Z85828 Personal history of other malignant neoplasm of skin: Secondary | ICD-10-CM | POA: Diagnosis not present

## 2016-07-07 DIAGNOSIS — Z471 Aftercare following joint replacement surgery: Secondary | ICD-10-CM | POA: Diagnosis not present

## 2016-07-07 DIAGNOSIS — Z7982 Long term (current) use of aspirin: Secondary | ICD-10-CM | POA: Diagnosis not present

## 2016-07-07 DIAGNOSIS — I2510 Atherosclerotic heart disease of native coronary artery without angina pectoris: Secondary | ICD-10-CM | POA: Diagnosis not present

## 2016-07-07 DIAGNOSIS — Z96651 Presence of right artificial knee joint: Secondary | ICD-10-CM | POA: Diagnosis not present

## 2016-07-07 DIAGNOSIS — E785 Hyperlipidemia, unspecified: Secondary | ICD-10-CM | POA: Diagnosis not present

## 2016-07-07 DIAGNOSIS — Z79891 Long term (current) use of opiate analgesic: Secondary | ICD-10-CM | POA: Diagnosis not present

## 2016-07-09 DIAGNOSIS — Z96651 Presence of right artificial knee joint: Secondary | ICD-10-CM | POA: Diagnosis not present

## 2016-07-09 DIAGNOSIS — R262 Difficulty in walking, not elsewhere classified: Secondary | ICD-10-CM | POA: Diagnosis not present

## 2016-07-09 DIAGNOSIS — M25661 Stiffness of right knee, not elsewhere classified: Secondary | ICD-10-CM | POA: Diagnosis not present

## 2016-07-09 DIAGNOSIS — Z471 Aftercare following joint replacement surgery: Secondary | ICD-10-CM | POA: Diagnosis not present

## 2016-07-09 DIAGNOSIS — M25561 Pain in right knee: Secondary | ICD-10-CM | POA: Diagnosis not present

## 2016-07-09 DIAGNOSIS — M25461 Effusion, right knee: Secondary | ICD-10-CM | POA: Diagnosis not present

## 2016-07-09 DIAGNOSIS — G8929 Other chronic pain: Secondary | ICD-10-CM | POA: Diagnosis not present

## 2016-07-13 DIAGNOSIS — M25461 Effusion, right knee: Secondary | ICD-10-CM | POA: Diagnosis not present

## 2016-07-13 DIAGNOSIS — R262 Difficulty in walking, not elsewhere classified: Secondary | ICD-10-CM | POA: Diagnosis not present

## 2016-07-13 DIAGNOSIS — M25561 Pain in right knee: Secondary | ICD-10-CM | POA: Diagnosis not present

## 2016-07-13 DIAGNOSIS — M25661 Stiffness of right knee, not elsewhere classified: Secondary | ICD-10-CM | POA: Diagnosis not present

## 2016-07-13 DIAGNOSIS — G8929 Other chronic pain: Secondary | ICD-10-CM | POA: Diagnosis not present

## 2016-07-16 DIAGNOSIS — Z96651 Presence of right artificial knee joint: Secondary | ICD-10-CM | POA: Diagnosis not present

## 2016-07-16 DIAGNOSIS — G8929 Other chronic pain: Secondary | ICD-10-CM | POA: Diagnosis not present

## 2016-07-16 DIAGNOSIS — R262 Difficulty in walking, not elsewhere classified: Secondary | ICD-10-CM | POA: Diagnosis not present

## 2016-07-16 DIAGNOSIS — M25661 Stiffness of right knee, not elsewhere classified: Secondary | ICD-10-CM | POA: Diagnosis not present

## 2016-07-16 DIAGNOSIS — M25561 Pain in right knee: Secondary | ICD-10-CM | POA: Diagnosis not present

## 2016-07-16 DIAGNOSIS — M25461 Effusion, right knee: Secondary | ICD-10-CM | POA: Diagnosis not present

## 2016-07-22 DIAGNOSIS — Z96651 Presence of right artificial knee joint: Secondary | ICD-10-CM | POA: Diagnosis not present

## 2016-07-22 DIAGNOSIS — G8929 Other chronic pain: Secondary | ICD-10-CM | POA: Diagnosis not present

## 2016-07-22 DIAGNOSIS — M25461 Effusion, right knee: Secondary | ICD-10-CM | POA: Diagnosis not present

## 2016-07-22 DIAGNOSIS — R262 Difficulty in walking, not elsewhere classified: Secondary | ICD-10-CM | POA: Diagnosis not present

## 2016-07-22 DIAGNOSIS — M25661 Stiffness of right knee, not elsewhere classified: Secondary | ICD-10-CM | POA: Diagnosis not present

## 2016-07-22 DIAGNOSIS — M25561 Pain in right knee: Secondary | ICD-10-CM | POA: Diagnosis not present

## 2016-07-23 DIAGNOSIS — G8929 Other chronic pain: Secondary | ICD-10-CM | POA: Diagnosis not present

## 2016-07-23 DIAGNOSIS — R262 Difficulty in walking, not elsewhere classified: Secondary | ICD-10-CM | POA: Diagnosis not present

## 2016-07-23 DIAGNOSIS — M25661 Stiffness of right knee, not elsewhere classified: Secondary | ICD-10-CM | POA: Diagnosis not present

## 2016-07-23 DIAGNOSIS — M25461 Effusion, right knee: Secondary | ICD-10-CM | POA: Diagnosis not present

## 2016-07-23 DIAGNOSIS — M25561 Pain in right knee: Secondary | ICD-10-CM | POA: Diagnosis not present

## 2016-07-23 DIAGNOSIS — Z96651 Presence of right artificial knee joint: Secondary | ICD-10-CM | POA: Diagnosis not present

## 2016-07-28 DIAGNOSIS — G8929 Other chronic pain: Secondary | ICD-10-CM | POA: Diagnosis not present

## 2016-07-28 DIAGNOSIS — Z96651 Presence of right artificial knee joint: Secondary | ICD-10-CM | POA: Diagnosis not present

## 2016-07-28 DIAGNOSIS — M25661 Stiffness of right knee, not elsewhere classified: Secondary | ICD-10-CM | POA: Diagnosis not present

## 2016-07-28 DIAGNOSIS — M25561 Pain in right knee: Secondary | ICD-10-CM | POA: Diagnosis not present

## 2016-07-28 DIAGNOSIS — M25461 Effusion, right knee: Secondary | ICD-10-CM | POA: Diagnosis not present

## 2016-07-28 DIAGNOSIS — R262 Difficulty in walking, not elsewhere classified: Secondary | ICD-10-CM | POA: Diagnosis not present

## 2016-07-30 DIAGNOSIS — M25661 Stiffness of right knee, not elsewhere classified: Secondary | ICD-10-CM | POA: Diagnosis not present

## 2016-07-30 DIAGNOSIS — G8929 Other chronic pain: Secondary | ICD-10-CM | POA: Diagnosis not present

## 2016-07-30 DIAGNOSIS — M25561 Pain in right knee: Secondary | ICD-10-CM | POA: Diagnosis not present

## 2016-07-30 DIAGNOSIS — Z96651 Presence of right artificial knee joint: Secondary | ICD-10-CM | POA: Diagnosis not present

## 2016-07-30 DIAGNOSIS — R262 Difficulty in walking, not elsewhere classified: Secondary | ICD-10-CM | POA: Diagnosis not present

## 2016-07-30 DIAGNOSIS — M25461 Effusion, right knee: Secondary | ICD-10-CM | POA: Diagnosis not present

## 2016-08-04 DIAGNOSIS — G8929 Other chronic pain: Secondary | ICD-10-CM | POA: Diagnosis not present

## 2016-08-04 DIAGNOSIS — M25461 Effusion, right knee: Secondary | ICD-10-CM | POA: Diagnosis not present

## 2016-08-04 DIAGNOSIS — R262 Difficulty in walking, not elsewhere classified: Secondary | ICD-10-CM | POA: Diagnosis not present

## 2016-08-04 DIAGNOSIS — Z96651 Presence of right artificial knee joint: Secondary | ICD-10-CM | POA: Diagnosis not present

## 2016-08-04 DIAGNOSIS — M25661 Stiffness of right knee, not elsewhere classified: Secondary | ICD-10-CM | POA: Diagnosis not present

## 2016-08-04 DIAGNOSIS — M25561 Pain in right knee: Secondary | ICD-10-CM | POA: Diagnosis not present

## 2016-08-06 DIAGNOSIS — M25461 Effusion, right knee: Secondary | ICD-10-CM | POA: Diagnosis not present

## 2016-08-06 DIAGNOSIS — M25661 Stiffness of right knee, not elsewhere classified: Secondary | ICD-10-CM | POA: Diagnosis not present

## 2016-08-06 DIAGNOSIS — R262 Difficulty in walking, not elsewhere classified: Secondary | ICD-10-CM | POA: Diagnosis not present

## 2016-08-06 DIAGNOSIS — Z96651 Presence of right artificial knee joint: Secondary | ICD-10-CM | POA: Diagnosis not present

## 2016-08-06 DIAGNOSIS — M25561 Pain in right knee: Secondary | ICD-10-CM | POA: Diagnosis not present

## 2016-08-06 DIAGNOSIS — G8929 Other chronic pain: Secondary | ICD-10-CM | POA: Diagnosis not present

## 2016-08-07 NOTE — Addendum Note (Signed)
Addendum  created 08/07/16 1412 by Rica Koyanagi, MD   Sign clinical note

## 2016-08-10 DIAGNOSIS — C4441 Basal cell carcinoma of skin of scalp and neck: Secondary | ICD-10-CM | POA: Diagnosis not present

## 2016-08-10 DIAGNOSIS — C44619 Basal cell carcinoma of skin of left upper limb, including shoulder: Secondary | ICD-10-CM | POA: Diagnosis not present

## 2016-08-10 DIAGNOSIS — B078 Other viral warts: Secondary | ICD-10-CM | POA: Diagnosis not present

## 2016-08-10 DIAGNOSIS — Z85828 Personal history of other malignant neoplasm of skin: Secondary | ICD-10-CM | POA: Diagnosis not present

## 2016-08-10 DIAGNOSIS — B079 Viral wart, unspecified: Secondary | ICD-10-CM | POA: Diagnosis not present

## 2016-08-11 DIAGNOSIS — M25561 Pain in right knee: Secondary | ICD-10-CM | POA: Diagnosis not present

## 2016-08-11 DIAGNOSIS — G8929 Other chronic pain: Secondary | ICD-10-CM | POA: Diagnosis not present

## 2016-08-11 DIAGNOSIS — R262 Difficulty in walking, not elsewhere classified: Secondary | ICD-10-CM | POA: Diagnosis not present

## 2016-08-11 DIAGNOSIS — M25661 Stiffness of right knee, not elsewhere classified: Secondary | ICD-10-CM | POA: Diagnosis not present

## 2016-08-11 DIAGNOSIS — Z96651 Presence of right artificial knee joint: Secondary | ICD-10-CM | POA: Diagnosis not present

## 2016-08-11 DIAGNOSIS — M25461 Effusion, right knee: Secondary | ICD-10-CM | POA: Diagnosis not present

## 2016-08-13 DIAGNOSIS — G8929 Other chronic pain: Secondary | ICD-10-CM | POA: Diagnosis not present

## 2016-08-13 DIAGNOSIS — M25661 Stiffness of right knee, not elsewhere classified: Secondary | ICD-10-CM | POA: Diagnosis not present

## 2016-08-13 DIAGNOSIS — M25461 Effusion, right knee: Secondary | ICD-10-CM | POA: Diagnosis not present

## 2016-08-13 DIAGNOSIS — Z96651 Presence of right artificial knee joint: Secondary | ICD-10-CM | POA: Diagnosis not present

## 2016-08-13 DIAGNOSIS — R262 Difficulty in walking, not elsewhere classified: Secondary | ICD-10-CM | POA: Diagnosis not present

## 2016-08-13 DIAGNOSIS — M25561 Pain in right knee: Secondary | ICD-10-CM | POA: Diagnosis not present

## 2016-08-17 DIAGNOSIS — Z96651 Presence of right artificial knee joint: Secondary | ICD-10-CM | POA: Diagnosis not present

## 2016-08-17 DIAGNOSIS — M25561 Pain in right knee: Secondary | ICD-10-CM | POA: Diagnosis not present

## 2016-08-17 DIAGNOSIS — R262 Difficulty in walking, not elsewhere classified: Secondary | ICD-10-CM | POA: Diagnosis not present

## 2016-08-17 DIAGNOSIS — M25461 Effusion, right knee: Secondary | ICD-10-CM | POA: Diagnosis not present

## 2016-08-17 DIAGNOSIS — M25661 Stiffness of right knee, not elsewhere classified: Secondary | ICD-10-CM | POA: Diagnosis not present

## 2016-08-17 DIAGNOSIS — G8929 Other chronic pain: Secondary | ICD-10-CM | POA: Diagnosis not present

## 2016-08-20 DIAGNOSIS — M25561 Pain in right knee: Secondary | ICD-10-CM | POA: Diagnosis not present

## 2016-08-20 DIAGNOSIS — Z96651 Presence of right artificial knee joint: Secondary | ICD-10-CM | POA: Diagnosis not present

## 2016-08-20 DIAGNOSIS — G8929 Other chronic pain: Secondary | ICD-10-CM | POA: Diagnosis not present

## 2016-08-20 DIAGNOSIS — M25461 Effusion, right knee: Secondary | ICD-10-CM | POA: Diagnosis not present

## 2016-08-20 DIAGNOSIS — R262 Difficulty in walking, not elsewhere classified: Secondary | ICD-10-CM | POA: Diagnosis not present

## 2016-08-20 DIAGNOSIS — M25661 Stiffness of right knee, not elsewhere classified: Secondary | ICD-10-CM | POA: Diagnosis not present

## 2016-08-25 DIAGNOSIS — R262 Difficulty in walking, not elsewhere classified: Secondary | ICD-10-CM | POA: Diagnosis not present

## 2016-08-25 DIAGNOSIS — M25661 Stiffness of right knee, not elsewhere classified: Secondary | ICD-10-CM | POA: Diagnosis not present

## 2016-08-25 DIAGNOSIS — G8929 Other chronic pain: Secondary | ICD-10-CM | POA: Diagnosis not present

## 2016-08-25 DIAGNOSIS — M25561 Pain in right knee: Secondary | ICD-10-CM | POA: Diagnosis not present

## 2016-08-25 DIAGNOSIS — Z96651 Presence of right artificial knee joint: Secondary | ICD-10-CM | POA: Diagnosis not present

## 2016-08-25 DIAGNOSIS — M25461 Effusion, right knee: Secondary | ICD-10-CM | POA: Diagnosis not present

## 2016-08-27 DIAGNOSIS — M25661 Stiffness of right knee, not elsewhere classified: Secondary | ICD-10-CM | POA: Diagnosis not present

## 2016-08-27 DIAGNOSIS — M25561 Pain in right knee: Secondary | ICD-10-CM | POA: Diagnosis not present

## 2016-08-27 DIAGNOSIS — R262 Difficulty in walking, not elsewhere classified: Secondary | ICD-10-CM | POA: Diagnosis not present

## 2016-08-27 DIAGNOSIS — G8929 Other chronic pain: Secondary | ICD-10-CM | POA: Diagnosis not present

## 2016-08-27 DIAGNOSIS — M25461 Effusion, right knee: Secondary | ICD-10-CM | POA: Diagnosis not present

## 2016-08-27 DIAGNOSIS — Z96651 Presence of right artificial knee joint: Secondary | ICD-10-CM | POA: Diagnosis not present

## 2016-09-02 DIAGNOSIS — M25461 Effusion, right knee: Secondary | ICD-10-CM | POA: Diagnosis not present

## 2016-09-02 DIAGNOSIS — M25661 Stiffness of right knee, not elsewhere classified: Secondary | ICD-10-CM | POA: Diagnosis not present

## 2016-09-02 DIAGNOSIS — R262 Difficulty in walking, not elsewhere classified: Secondary | ICD-10-CM | POA: Diagnosis not present

## 2016-09-02 DIAGNOSIS — G8929 Other chronic pain: Secondary | ICD-10-CM | POA: Diagnosis not present

## 2016-09-02 DIAGNOSIS — M25561 Pain in right knee: Secondary | ICD-10-CM | POA: Diagnosis not present

## 2016-09-02 DIAGNOSIS — Z96651 Presence of right artificial knee joint: Secondary | ICD-10-CM | POA: Diagnosis not present

## 2016-11-02 DIAGNOSIS — H2513 Age-related nuclear cataract, bilateral: Secondary | ICD-10-CM | POA: Diagnosis not present

## 2016-11-17 DIAGNOSIS — Z471 Aftercare following joint replacement surgery: Secondary | ICD-10-CM | POA: Diagnosis not present

## 2016-11-17 DIAGNOSIS — Z96651 Presence of right artificial knee joint: Secondary | ICD-10-CM | POA: Diagnosis not present

## 2016-11-24 DIAGNOSIS — D1801 Hemangioma of skin and subcutaneous tissue: Secondary | ICD-10-CM | POA: Diagnosis not present

## 2016-11-24 DIAGNOSIS — L814 Other melanin hyperpigmentation: Secondary | ICD-10-CM | POA: Diagnosis not present

## 2016-11-24 DIAGNOSIS — Z85820 Personal history of malignant melanoma of skin: Secondary | ICD-10-CM | POA: Diagnosis not present

## 2016-11-24 DIAGNOSIS — L570 Actinic keratosis: Secondary | ICD-10-CM | POA: Diagnosis not present

## 2016-11-24 DIAGNOSIS — L821 Other seborrheic keratosis: Secondary | ICD-10-CM | POA: Diagnosis not present

## 2016-11-24 DIAGNOSIS — Z85828 Personal history of other malignant neoplasm of skin: Secondary | ICD-10-CM | POA: Diagnosis not present

## 2016-11-24 DIAGNOSIS — C44519 Basal cell carcinoma of skin of other part of trunk: Secondary | ICD-10-CM | POA: Diagnosis not present

## 2016-12-24 ENCOUNTER — Other Ambulatory Visit: Payer: Self-pay | Admitting: Cardiovascular Disease

## 2016-12-24 NOTE — Telephone Encounter (Signed)
REFILL 

## 2017-01-14 DIAGNOSIS — Z23 Encounter for immunization: Secondary | ICD-10-CM | POA: Diagnosis not present

## 2017-03-12 DIAGNOSIS — H2512 Age-related nuclear cataract, left eye: Secondary | ICD-10-CM | POA: Diagnosis not present

## 2017-03-24 DIAGNOSIS — H25812 Combined forms of age-related cataract, left eye: Secondary | ICD-10-CM | POA: Diagnosis not present

## 2017-03-24 DIAGNOSIS — H2512 Age-related nuclear cataract, left eye: Secondary | ICD-10-CM | POA: Diagnosis not present

## 2017-04-02 ENCOUNTER — Telehealth: Payer: Self-pay | Admitting: Cardiovascular Disease

## 2017-04-02 DIAGNOSIS — E7800 Pure hypercholesterolemia, unspecified: Secondary | ICD-10-CM

## 2017-04-02 NOTE — Telephone Encounter (Signed)
Returned call to wife (ok per DPR)-patient has upcoming appt with Dr. Gwenlyn Found and states he usually gets lab work completed prior to.  Would like to know if this is needed this year.    Advised Dr. Gwenlyn Found primary nurse is OOO today but would route to her to follow up on and order what is needed.     Wife aware and request lab orders be mailed to them.

## 2017-04-02 NOTE — Telephone Encounter (Signed)
Spoke with pt wife--ok per DPR--to inform her that I have placed the order for pt labs and will mail them today with instruction slip for the lab located in our office. Pt wife verbalized understanding and thanks.

## 2017-04-02 NOTE — Telephone Encounter (Signed)
New message  Pt wife verbalized that she is calling for the RN  She stated that she has some questions about blood work

## 2017-04-14 DIAGNOSIS — L300 Nummular dermatitis: Secondary | ICD-10-CM | POA: Diagnosis not present

## 2017-04-14 DIAGNOSIS — Z85828 Personal history of other malignant neoplasm of skin: Secondary | ICD-10-CM | POA: Diagnosis not present

## 2017-04-27 DIAGNOSIS — E7800 Pure hypercholesterolemia, unspecified: Secondary | ICD-10-CM | POA: Diagnosis not present

## 2017-04-27 LAB — LIPID PANEL
CHOL/HDL RATIO: 3 ratio (ref 0.0–5.0)
CHOLESTEROL TOTAL: 129 mg/dL (ref 100–199)
HDL: 43 mg/dL (ref >39)
LDL Calculated: 66 mg/dL (ref 0–99)
TRIGLYCERIDES: 99 mg/dL (ref 0–149)
VLDL Cholesterol Cal: 20 mg/dL (ref 5–40)

## 2017-05-04 ENCOUNTER — Encounter: Payer: Self-pay | Admitting: Cardiovascular Disease

## 2017-05-04 ENCOUNTER — Ambulatory Visit: Payer: PPO | Admitting: Cardiovascular Disease

## 2017-05-04 DIAGNOSIS — I25700 Atherosclerosis of coronary artery bypass graft(s), unspecified, with unstable angina pectoris: Secondary | ICD-10-CM

## 2017-05-04 DIAGNOSIS — E7800 Pure hypercholesterolemia, unspecified: Secondary | ICD-10-CM | POA: Diagnosis not present

## 2017-05-04 DIAGNOSIS — R002 Palpitations: Secondary | ICD-10-CM | POA: Diagnosis not present

## 2017-05-04 NOTE — Addendum Note (Signed)
Addended by: Zebedee Iba on: 05/04/2017 04:12 PM   Modules accepted: Orders

## 2017-05-04 NOTE — Patient Instructions (Signed)

## 2017-05-04 NOTE — Assessment & Plan Note (Signed)
History of hyperlipidemia on statin therapy with lipid profile performed 04/27/17 revealing LDL 66 and HDL 43.

## 2017-05-04 NOTE — Progress Notes (Signed)
05/04/2017 Marvin Ingram   07/23/1945  [id number]  Primary Physician [name], MD Primary Cardiologist: Lorretta Harp MD Lupe Carney, [place]  HPI:  Marvin Ingram is a 72 y.o.  fit-appearing married Caucasian male father of 3 children, grandfather, 5 grandchildren formerly a patient of [name]. I last saw him in the office  04/24/16 . He is accompanied by his wife Marvin Ingram  today. 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 He does have one son in the [employer] who lives in [place] , [place].his primary care physician is [name]. He has a history of coronary artery disease status post non-ST segment elevation myocardial infarction April 2007 with cardiac catheterization performed by [name] 4/6/7 revealing high-grade mid and distal LAD disease which were intervened on with Taxus drug-eluting stents. His RCA and circumflex were free of significant disease and he had normal LV function. He smoked remotely and stopped in 1965. He has treated hyperlipidemia at goal for secondary prevention on low-dose atorvastatin. His most recent lipid profile performed 01/14/16 revealed a total cholesterol 144, LDL 78 and HDL of 42. Otherwise, [facility] and denies chest pain or shortness of breath. He had noticed new onset of palpitations occurring on a daily basis over the last several months prior to his last office visit. He  Does admit to drinking caffeinated tea which she has since stopped and his palpitations have resolved. He had a right total knee replacement by Dr. Augustin Coupe 06/29/16 and now has pain in his left knee.   Current Meds  Medication Sig  . acetaminophen (TYLENOL) 500 MG tablet Take 500 mg by mouth every 6 (six) hours as needed (for pain/headache).  Marland Kitchen aspirin EC 81 MG tablet Take 81 mg by mouth 2 (two) times daily.  Marland Kitchen atorvastatin (LIPITOR) 10 MG tablet TAKE 1/2 TABLET BY MOUTH EVERY DAY  . Omega-3 Fatty Acids (FISH  OIL) 1200 MG CAPS Take 1,200 mg by mouth daily.      Allergies  Allergen Reactions  . No Known Allergies     Social History   Socioeconomic History  . Marital status: Married    Spouse name: Not on file  . Number of children: Not on file  . Years of education: Not on file  . Highest education level: Not on file  Social Needs  . Financial resource strain: Not on file  . Food insecurity - worry: Not on file  . Food insecurity - inability: Not on file  . Transportation needs - medical: Not on file  . Transportation needs - non-medical: Not on file  Occupational History  . Not on file  Tobacco Use  . Smoking status: Former Smoker    Years: 2.00    Types: Cigarettes  . Smokeless tobacco: Never Used  . Tobacco comment: smoked off and on during college  Substance and Sexual Activity  . Alcohol use: Not on file    Comment: occassionally  . Drug use: No  . Sexual activity: Not on file  Other Topics Concern  . Not on file  Social History Narrative  . Not on file     Review of Systems: General: negative for chills, fever, night sweats or weight changes.  Cardiovascular: negative for chest pain, dyspnea on exertion, edema, orthopnea, palpitations, paroxysmal nocturnal dyspnea or shortness of breath Dermatological: negative for rash Respiratory: negative for cough or wheezing Urologic: negative for  hematuria Abdominal: negative for nausea, vomiting, diarrhea, bright red blood per rectum, melena, or hematemesis Neurologic: negative for visual changes, syncope, or dizziness All other systems reviewed and are otherwise negative except as noted above.    Blood pressure (!) 152/78, pulse (!) 54, height 5\' 9"  (1.753 m), weight 186 lb 6.4 oz (84.6 kg).  General appearance: alert and no distress Neck: no adenopathy, no carotid bruit, no JVD, supple, symmetrical, trachea midline and thyroid not enlarged, symmetric, no tenderness/mass/nodules Lungs: clear to auscultation  bilaterally Heart: regular rate and rhythm, S1, S2 normal, no murmur, click, rub or gallop Extremities: extremities normal, atraumatic, no cyanosis or edema Pulses: 2+ and symmetric Skin: Skin color, texture, turgor normal. No rashes or lesions Neurologic: Alert and oriented X 3, normal strength and tone. Normal symmetric reflexes. Normal coordination and gait  EKG sinus bradycardia 54 without ST or T-wave changes. I Personally reviewed this EKG.  ASSESSMENT AND PLAN:   CAD (coronary artery disease) of artery bypass graft History of CAD status post cardiac catheterization by [name] after a non-STEMI 4/6/7 revealing mid and distal LAD disease but was intervened on using Taxus drug-eluting stents. His RCA and circumflex were free of significant disease and normal LV function he denies chest pain or shortness of breath.  Hyperlipidemia History of hyperlipidemia on statin therapy with lipid profile performed 04/27/17 revealing LDL 66 and HDL 43.  Palpitations History of occasional palpitations probably induced by caffeine      Lorretta Harp MD [facility], [facility] 05/04/2017 3:06 PM

## 2017-05-04 NOTE — Assessment & Plan Note (Signed)
History of occasional palpitations probably induced by caffeine

## 2017-05-04 NOTE — Assessment & Plan Note (Signed)
History of CAD status post cardiac catheterization by [name] after a non-STEMI 4/6/7 revealing mid and distal LAD disease but was intervened on using Taxus drug-eluting stents. His RCA and circumflex were free of significant disease and normal LV function he denies chest pain or shortness of breath.

## 2017-05-05 ENCOUNTER — Telehealth: Payer: Self-pay | Admitting: Cardiovascular Disease

## 2017-05-05 NOTE — Telephone Encounter (Signed)
Mrs. Espin (patient wife) calling,   States that on patient AVS it states that he had a Coronary bypass and Mrs. Lieske states that patient had two stents installed.

## 2017-05-05 NOTE — Telephone Encounter (Signed)
Returned call to wife (ok per DPR)-states on patients AVS that he received it states he has CAD involving bypass grafts.  Wife states he has never had bypass on stents placed.      Per chart review-no hx of CABG.   Advised I would route to Dr. Gwenlyn Found and his primary to correct.

## 2017-05-05 NOTE — Telephone Encounter (Signed)
This has been listed as a diagnosis for the past 3 office visits, dating back to November 2017.

## 2017-05-06 DIAGNOSIS — M5117 Intervertebral disc disorders with radiculopathy, lumbosacral region: Secondary | ICD-10-CM | POA: Diagnosis not present

## 2017-05-06 DIAGNOSIS — M4687 Other specified inflammatory spondylopathies, lumbosacral region: Secondary | ICD-10-CM | POA: Diagnosis not present

## 2017-05-06 DIAGNOSIS — M4686 Other specified inflammatory spondylopathies, lumbar region: Secondary | ICD-10-CM | POA: Diagnosis not present

## 2017-05-06 DIAGNOSIS — M5116 Intervertebral disc disorders with radiculopathy, lumbar region: Secondary | ICD-10-CM | POA: Diagnosis not present

## 2017-05-06 DIAGNOSIS — G8929 Other chronic pain: Secondary | ICD-10-CM | POA: Diagnosis not present

## 2017-05-06 DIAGNOSIS — M79605 Pain in left leg: Secondary | ICD-10-CM | POA: Diagnosis not present

## 2017-05-06 DIAGNOSIS — M5136 Other intervertebral disc degeneration, lumbar region: Secondary | ICD-10-CM | POA: Diagnosis not present

## 2017-05-06 DIAGNOSIS — M5137 Other intervertebral disc degeneration, lumbosacral region: Secondary | ICD-10-CM | POA: Diagnosis not present

## 2017-05-06 DIAGNOSIS — M1712 Unilateral primary osteoarthritis, left knee: Secondary | ICD-10-CM | POA: Diagnosis not present

## 2017-05-06 DIAGNOSIS — M4726 Other spondylosis with radiculopathy, lumbar region: Secondary | ICD-10-CM | POA: Diagnosis not present

## 2017-05-06 NOTE — Telephone Encounter (Signed)
Agreed that pt has never had CABG

## 2017-05-07 DIAGNOSIS — Z961 Presence of intraocular lens: Secondary | ICD-10-CM | POA: Diagnosis not present

## 2017-05-12 ENCOUNTER — Other Ambulatory Visit: Payer: Self-pay | Admitting: Orthopedic Surgery

## 2017-05-12 DIAGNOSIS — R531 Weakness: Secondary | ICD-10-CM

## 2017-05-12 DIAGNOSIS — M545 Low back pain, unspecified: Secondary | ICD-10-CM

## 2017-05-14 ENCOUNTER — Ambulatory Visit
Admission: RE | Admit: 2017-05-14 | Discharge: 2017-05-14 | Disposition: A | Discharge status: Home / Self Care | Payer: PPO | Source: Ambulatory Visit | Attending: Orthopedic Surgery | Admitting: Orthopedic Surgery

## 2017-05-14 DIAGNOSIS — M48061 Spinal stenosis, lumbar region without neurogenic claudication: Secondary | ICD-10-CM | POA: Diagnosis not present

## 2017-05-14 DIAGNOSIS — M545 Low back pain, unspecified: Secondary | ICD-10-CM

## 2017-05-14 DIAGNOSIS — R531 Weakness: Secondary | ICD-10-CM

## 2017-05-21 DIAGNOSIS — M79605 Pain in left leg: Secondary | ICD-10-CM | POA: Diagnosis not present

## 2017-05-21 DIAGNOSIS — Z9889 Other specified postprocedural states: Secondary | ICD-10-CM | POA: Diagnosis not present

## 2017-05-21 DIAGNOSIS — M5417 Radiculopathy, lumbosacral region: Secondary | ICD-10-CM | POA: Diagnosis not present

## 2017-05-21 DIAGNOSIS — M5126 Other intervertebral disc displacement, lumbar region: Secondary | ICD-10-CM | POA: Diagnosis not present

## 2017-06-18 ENCOUNTER — Other Ambulatory Visit: Payer: Self-pay | Admitting: Cardiovascular Disease

## 2017-06-30 DIAGNOSIS — M9983 Other biomechanical lesions of lumbar region: Secondary | ICD-10-CM | POA: Diagnosis not present

## 2017-06-30 DIAGNOSIS — M79605 Pain in left leg: Secondary | ICD-10-CM | POA: Diagnosis not present

## 2017-06-30 DIAGNOSIS — M5126 Other intervertebral disc displacement, lumbar region: Secondary | ICD-10-CM | POA: Diagnosis not present

## 2017-07-08 DIAGNOSIS — M170 Bilateral primary osteoarthritis of knee: Secondary | ICD-10-CM | POA: Diagnosis not present

## 2017-07-08 DIAGNOSIS — M792 Neuralgia and neuritis, unspecified: Secondary | ICD-10-CM | POA: Diagnosis not present

## 2017-07-08 DIAGNOSIS — G8929 Other chronic pain: Secondary | ICD-10-CM | POA: Diagnosis not present

## 2017-07-08 DIAGNOSIS — M9983 Other biomechanical lesions of lumbar region: Secondary | ICD-10-CM | POA: Diagnosis not present

## 2017-08-03 DIAGNOSIS — Z96651 Presence of right artificial knee joint: Secondary | ICD-10-CM | POA: Diagnosis not present

## 2017-08-05 DIAGNOSIS — M79605 Pain in left leg: Secondary | ICD-10-CM | POA: Diagnosis not present

## 2017-08-05 DIAGNOSIS — M25562 Pain in left knee: Secondary | ICD-10-CM | POA: Diagnosis not present

## 2017-08-05 DIAGNOSIS — G8929 Other chronic pain: Secondary | ICD-10-CM | POA: Diagnosis not present

## 2017-08-13 DIAGNOSIS — R251 Tremor, unspecified: Secondary | ICD-10-CM | POA: Diagnosis not present

## 2017-08-13 DIAGNOSIS — R269 Unspecified abnormalities of gait and mobility: Secondary | ICD-10-CM | POA: Insufficient documentation

## 2017-08-20 DIAGNOSIS — L570 Actinic keratosis: Secondary | ICD-10-CM | POA: Diagnosis not present

## 2017-08-20 DIAGNOSIS — L814 Other melanin hyperpigmentation: Secondary | ICD-10-CM | POA: Diagnosis not present

## 2017-08-20 DIAGNOSIS — C44712 Basal cell carcinoma of skin of right lower limb, including hip: Secondary | ICD-10-CM | POA: Diagnosis not present

## 2017-08-20 DIAGNOSIS — D1801 Hemangioma of skin and subcutaneous tissue: Secondary | ICD-10-CM | POA: Diagnosis not present

## 2017-08-20 DIAGNOSIS — L821 Other seborrheic keratosis: Secondary | ICD-10-CM | POA: Diagnosis not present

## 2017-08-20 DIAGNOSIS — L738 Other specified follicular disorders: Secondary | ICD-10-CM | POA: Diagnosis not present

## 2017-08-20 DIAGNOSIS — Z85828 Personal history of other malignant neoplasm of skin: Secondary | ICD-10-CM | POA: Diagnosis not present

## 2017-08-26 ENCOUNTER — Ambulatory Visit (INDEPENDENT_AMBULATORY_CARE_PROVIDER_SITE_OTHER): Payer: PPO | Admitting: Neurology

## 2017-08-26 ENCOUNTER — Encounter: Payer: Self-pay | Admitting: Neurology

## 2017-08-26 ENCOUNTER — Telehealth: Payer: Self-pay | Admitting: Neurology

## 2017-08-26 VITALS — BP 133/80 | HR 78 | Ht 69.0 in | Wt 176.0 lb

## 2017-08-26 DIAGNOSIS — G20 Parkinson's disease: Secondary | ICD-10-CM

## 2017-08-26 NOTE — Progress Notes (Signed)
Subjective:    Patient ID: ANDERSEN IORIO is a 72 y.o. male.  HPI     Star Age, MD, PhD [facility] [address] [place], [place] [address]  Dear Dr. Redmond Pulling,   I saw your patient, Ewald Beg, upon your kind request in my neurologic clinic today for initial consultation of his tremor and gait disorder. The patient is accompanied by his wife today. As you know, Mr. Kamphaus is a 72 year old right-handed gentleman with an underlying medical history of heart disease with status post MI, history of bradycardia, basal cell cancer, arthritis, status post left knee injection and status post right knee replacement, hyperlipidemia, and mildly overweight state, who reports an intermittent left hand tremors as well as problems with his gait. I reviewed your office note from 08/13/2017.  He noticed a intermittent left hand tremor probably around November 2018. He has not had any recent falls. He has had difficulty with his left knee, recent injection with cortisone has helped. He had a right total knee replacement last year in April 2018 which went well. He had his surgery with Dr. Ronnie Derby. His injection was under Dr. Evelene Croon. He had recent cataract surgery in January 2019 on the left, he had some problems after his cataract surgery on the right in 2016. He reports that he has not been able to exercise and previously was really active in his exercise routine, he recently reenrolled at the [facility] [facility] [employer] with his wife.   He had a recent lumbar spine MRI without contrast on 05/14/2017 and I reviewed the results: IMPRESSION: L2-3: Endplate osteophytes and bulging of the disc more towards the left. Facet hypertrophy more on the left. Mild stenosis of the left lateral recess in the intervertebral foramen on the left. Definite neural compression is not demonstrated, but left-sided neural irritation could occur at this level.   Previous distance surgical changes at L3-4, L4-5  and L5-S1. Sufficient patency of the canal and foramina at those levels, with the single exception of some chronic appearing bony foraminal narrowing on the left at L5-S1 without distinct compression of the exiting left L5 nerve. This structure could be irritated.  He is married and lives with his wife. They have 3 children. He is a nonsmoker and does not typically utilize alcohol, animal caffeine is reported. He is retired, was a [employer] social research officer.   His Past Medical History Is Significant For: Past Medical History:  Diagnosis Date  . Arthritis   . Basal cell carcinoma   . Bradycardia    relative  . CAD (coronary artery disease)   . Heart disease   . Hyperlipidemia   . Myocardial infarction [facility]) 2007   has 2 stents  . PONV (postoperative nausea and vomiting)    problems yrears ago in 1963  . Seborrheic keratoses     His Past Surgical History Is Significant For: Past Surgical History:  Procedure Laterality Date  . back (disc),  1993  . CATARACT EXTRACTION Right 2016  . DOPPLER ECHOCARDIOGRAPHY    . GALLBLADDER SURGERY  1994  . KNEE ARTHROPLASTY  1963/1990  . NM MYOVIEW LTD    . TOTAL KNEE ARTHROPLASTY Right 06/29/2016   Procedure: TOTAL KNEE ARTHROPLASTY;  Surgeon: [name], MD;  Location: [place];  Service: Orthopedics;  Laterality: Right;    His Family History Is Significant For: No family history on file.  His Social History Is Significant For: Social History   Socioeconomic History  . Marital status: Married  Spouse name: Not on file  . Number of children: Not on file  . Years of education: Not on file  . Highest education level: Not on file  Occupational History  . Not on file  Social Needs  . Financial resource strain: Not on file  . Food insecurity:    Worry: Not on file    Inability: Not on file  . Transportation needs:    Medical: Not on file    Non-medical: Not on file  Tobacco Use  . Smoking status: Former Smoker    Years: 2.00    Types:  Cigarettes  . Smokeless tobacco: Never Used  . Tobacco comment: smoked off and on during college  Substance and Sexual Activity  . Alcohol use: Not on file    Comment: occassionally  . Drug use: No  . Sexual activity: Not on file  Lifestyle  . Physical activity:    Days per week: Not on file    Minutes per session: Not on file  . Stress: Not on file  Relationships  . Social connections:    Talks on phone: Not on file    Gets together: Not on file    Attends religious service: Not on file    Active member of club or organization: Not on file    Attends meetings of clubs or organizations: Not on file    Relationship status: Not on file  Other Topics Concern  . Not on file  Social History Narrative  . Not on file    His Allergies Are:  Allergies  Allergen Reactions  . Amoxicillin   . Doxycycline   . Gatifloxacin   . Prednisone   :   His Current Medications Are:  Outpatient Encounter Medications as of 08/26/2017  Medication Sig  . acetaminophen (TYLENOL) 500 MG tablet Take 500 mg by mouth every 6 (six) hours as needed (for pain/headache).  Marland Kitchen aspirin EC 81 MG tablet Take 81 mg by mouth 2 (two) times daily.  Marland Kitchen atorvastatin (LIPITOR) 10 MG tablet TAKE 1/2 TABLET BY MOUTH EVERY DAY  . Omega-3 Fatty Acids (FISH OIL) 1200 MG CAPS Take 1,200 mg by mouth daily.    No facility-administered encounter medications on file as of 08/26/2017.   :  Review of Systems:  Out of a complete 14 point review of systems, all are reviewed and negative with the exception of these symptoms as listed below:  Review of Systems  Neurological:       Pt presents today to discuss his tremor. Pt notices the tremors in his left hand and arm. Pt is right handed. Pt also is complaining of drooling.    Objective:  Neurological Exam  Physical Exam Physical Examination:   Vitals:   08/26/17 1000  BP: 133/80  Pulse: 78    General Examination: The patient is a very pleasant 72 y.o. male in no  acute distress. He appears well-developed and well-nourished and well groomed.   HEENT: Normocephalic, atraumatic, pupils are equal, round and reactive to light and accommodation. Extraocular tracking is mildly impaired, he has mild facial masking. He has lens implants. Voice is mildly hypophonic, no voice tremor noted, he has mild nuchal rigidity. Hearing is grossly intact, airway examination is benign, mild mouth dryness noted, no tongue dyskinesias, no obvious sialorrhea.   Chest: Clear to auscultation without wheezing, rhonchi or crackles noted.  Heart: S1+S2+0, regular and normal without murmurs, rubs or gallops noted.   Abdomen: Soft, non-tender and non-distended with normal bowel  sounds appreciated on auscultation.  Extremities: There is trace pitting edema in the distal lower extremities bilaterally, right more than left.  Skin: Warm and dry without trophic changes noted.  recent removal of basal cell skin cancer from the right knee.   Musculoskeletal: exam reveals no obvious joint deformities, tenderness or joint swelling or erythema.  unremarkable scar from right knee replacement surgery, right knee circumference larger than left, mild swelling medially noted left knee and mild tenderness reported.  Neurologically:  Mental status: The patient is awake, alert and oriented in all 4 spheres. His immediate and remote memory, attention, language skills and fund of knowledge are appropriate, perhaps mild bradyphrenia. There is no evidence of aphasia, agnosia, apraxia or anomia. Speech is clear with normal prosody and enunciation. Thought process is linear. Mood is normal and affect is normal.  Cranial nerves II - XII are as described above under HEENT exam. In addition: shoulder shrug is normal with equal shoulder height noted. Motor exam: Normal bulk, normal strength for age, mild increase in tone in both upper extremities. He has a mild intermittent resting tremor in the right upper  extremity, not actually visible on the left. He has no significant postural tremor or action tremor.  On 08/26/2017: On Archimedes spiral drawing he has no significant difficulty with the right, mild difficulty with left but not consistent with tremors, more in the realm of insecurity. Handwriting with the right is legible, mildly tremulous, not particularly micrographic. On fine motor skill testing he has mild difficulty with finger taps, hand movements, rapid alternating patting, foot taps and foot agility, left-sided little worse than right. Stands without difficulty and posture is mild to moderately stooped for age. He walks with decrease in stride length, decrease pace, decreased arm swing left more so than right. He has a little bit of difficulty with the right knee and foot turns outwards while walking.  Cerebellar testing: No dysmetria or intention tremor.  Sensory exam: intact to light touch in the upper and lower extremities.   Assessment and Plan:   In summary, CANNON ARREOLA is a very pleasant 72 y.o.-year old malewith an underlying medical history of heart disease with status post MI, history of bradycardia, basal cell cancer, arthritis, status post left knee injection and status post right knee replacement, hyperlipidemia, and mildly overweight state, who presents for neurologic consultation of his intermittent tremor and gait disorder, which he noticed since November 2018. On examination, he has some signs of parkinsonism, with left-sided predominance noted. 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  with a history of parkinsonism/Parkinson's disease.  I had a long chat with the patient and his wife about His symptoms, my findings and the diagnosis of parkinsonism/Parksinson's disease, its prognosis and treatment options. We talked about medical treatments and non-pharmacological approaches. We talked about maintaining a healthy lifestyle in general. I encouraged the  patient to eat healthy, exercise daily and keep well hydrated, to keep a scheduled bedtime and wake time routine, to not skip any meals and eat healthy snacks in between meals and to have protein with every meal. In particular, I stressed the importance of regular exercise, within of course the patient's own mobility limitations.   As far as further diagnostic testing is concerned, I suggested: I would like to proceed with a nuclear medicine scan called DaT scan. Consent was obtained and we will request and turns authorization to help with narrowing down the diagnosis, patient information was also provided.   As far  as medications are concerned, I recommended the following at this time: We can initiate a trial of low-dose Sinemet generic with gradual titration. Patient and wife would like to hold off at this time and monitor symptoms a little bit longer, work on a better regimen for his exercise routine.  I suggested a 6 month follow-up, we will keep them posted as to his scan results. I answered all their questions today and the patient and his wife were in agreement.   Thank you very much for allowing me to participate in the care of this nice patient. If I can be of any further assistance to you please do not hesitate to call me at [phone].  Sincerely,   Star Age, MD, PhD

## 2017-08-26 NOTE — Patient Instructions (Addendum)
I think you have signs and symptoms of parkinson's like disease, called parkinsonism, possible left-sided Parkinson's disease.  This can affect your balance, your memory, your mood, your bowel and bladder function, your posture, balance and walking and your activities of daily living. However, we can consider supportive treatments and even many medication options for symptomatic treatment.   I do want to suggest a few things today:   Remember to drink plenty of fluid at least 6 glasses (8 oz each), eat healthy meals and do not skip any meals. Try to eat protein with a every meal and eat a healthy snack such as fruit or nuts in between meals. Try to keep a regular sleep-wake schedule and try to exercise daily, particularly in the form of walking, 20-30 minutes a day, if you can.   Try to stay active physically and mentally. Engage in social activities in your community and with your family and try to keep up with current events by reading the newspaper or watching the news. Try to do word puzzles and you may like to do puzzles and brain games on the computer such as on [email or web address]   As far as your medications are concerned, we will hold off on any meds at this time.   As far as diagnostic testing, I will order: DaT scan: This is a specialized brain scan designed to help with diagnosis of tremor disorders. A radioactive marker gets injected and the uptake is measured in the brain and compared to normal controls and right side is compared to the left, a change in uptake can help with diagnosis of certain tremor disorders. A brain MRI on the other hand is a brain scan that helps look at the brain structure in more detail overall and look for age-related changes, blood vessel related changes and look for stroke and volume loss which we call atrophy.    I would like to see you back in 4 months, sooner if we need to. Please call us with any interim questions, concerns, problems, or updates.

## 2017-08-26 NOTE — Telephone Encounter (Signed)
Auth Pending for DAT scan.

## 2017-08-31 ENCOUNTER — Telehealth: Payer: Self-pay | Admitting: Radiology

## 2017-09-07 NOTE — Telephone Encounter (Signed)
DAT Scan approved .

## 2017-10-11 DIAGNOSIS — Z0000 Encounter for general adult medical examination without abnormal findings: Secondary | ICD-10-CM | POA: Diagnosis not present

## 2017-10-11 DIAGNOSIS — D485 Neoplasm of uncertain behavior of skin: Secondary | ICD-10-CM | POA: Diagnosis not present

## 2017-10-11 DIAGNOSIS — R251 Tremor, unspecified: Secondary | ICD-10-CM | POA: Diagnosis not present

## 2017-10-29 DIAGNOSIS — H04123 Dry eye syndrome of bilateral lacrimal glands: Secondary | ICD-10-CM | POA: Diagnosis not present

## 2017-11-10 DIAGNOSIS — L600 Ingrowing nail: Secondary | ICD-10-CM | POA: Diagnosis not present

## 2017-12-27 ENCOUNTER — Ambulatory Visit: Payer: PPO | Admitting: Neurology

## 2018-01-19 ENCOUNTER — Ambulatory Visit (INDEPENDENT_AMBULATORY_CARE_PROVIDER_SITE_OTHER): Payer: PPO | Admitting: Neurology

## 2018-01-19 ENCOUNTER — Encounter: Payer: Self-pay | Admitting: Neurology

## 2018-01-19 VITALS — BP 152/76 | HR 53 | Ht 69.0 in | Wt 181.0 lb

## 2018-01-19 DIAGNOSIS — G20 Parkinson's disease: Secondary | ICD-10-CM

## 2018-01-19 NOTE — Patient Instructions (Signed)
We can reconsider the DaT scan if you wish, you may need a re-authorization.   We can try you on a low dose of Sinemet (generic name: carbidopa-levodopa) 25/100 mg: Take half a pill twice daily (8 AM and noon) for one week, then half a pill 3 times a day (8 AM, noon, and 4 PM) for one week, then one pill 3 times a day thereafter. Please try to take the medication away from you mealtimes, that is, ideally either one hour before or 2 hours after your meal to ensure optimal absorption. The medication can interfere with the protein content of your meal and trying to the protein in your food and therefore not get fully absorbed.   Common side effects reported are: Nausea, vomiting, sedation, confusion, lightheadedness. Rare side effects include hallucinations, severe nausea or vomiting, diarrhea and significant drop in blood pressure especially when going from lying to standing or from sitting to standing.

## 2018-01-19 NOTE — Progress Notes (Signed)
Subjective:    Patient ID: Marvin Ingram is a 72 y.o. male.  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 The patient is accompanied by his wife today. I first met him on 08/26/2017 at the request of his primary care physician, at which time he reported left hand tremors and gait difficulties. He had findings in keeping with parkinsonism with left-sided predominance noted. I suggested we proceed with a DaT scan.  Today, 01/19/2018: He reports a fall this morning and hit his elbow. His tremor has become worse. He did not pursue the DaT scan. He would rather not at this time. Does have some soreness in the right elbow but does not think he needs to get an x-ray and get checked out for this, no bruise, no significant swelling, superficial carpet burn type lesion noticeable. Mobility okay, no crepitation noted. He tries to exercise regularly at the gym, typically uses a stationary bike and light weights and walks on the track. He would rather not consider medication at this point. They have been to 2 support meetings.  The patient's allergies, current medications, family history, past medical history, past social history, past surgical history and problem list were reviewed and updated as appropriate.   Previously:   08/26/2017: (He) reports an intermittent left hand tremors as well as problems with his gait. I reviewed your office note from 08/13/2017.  He noticed a intermittent left hand tremor probably around November 2018. He has not had any recent falls. He has had difficulty with his left knee, recent injection with cortisone has helped. He had a right total  knee replacement last year in April 2018 which went well. He had his surgery with Dr. Ronnie Derby. His injection was under Dr. Evelene Croon. He had recent cataract surgery in January 2019 on the left, he had some problems after his cataract surgery on the right in 2016. He reports that he has not been able to exercise and previously was really active in his exercise routine, he recently reenrolled at the [facility] with his wife.    He had a recent lumbar spine MRI without contrast on 05/14/2017 and I reviewed the results: IMPRESSION: L2-3: Endplate osteophytes and bulging of the disc more towards the left. Facet hypertrophy more on the left. Mild stenosis of the left lateral recess in the intervertebral foramen on the left. Definite neural compression is not demonstrated, but left-sided neural irritation could occur at this level.   Previous distance surgical changes at L3-4, L4-5 and L5-S1. Sufficient patency of the canal and foramina at those levels, with the single exception of some chronic appearing bony foraminal narrowing on the left at L5-S1 without distinct compression of the exiting left L5 nerve. This structure could be irritated.   He is married and lives with his wife. They have 3 children. He is a nonsmoker and does not typically utilize alcohol, animal caffeine is reported. He is retired, was a [employer] social research officer.   His Past Medical History Is Significant For: Past Medical History:  Diagnosis Date  . Arthritis   . Basal cell carcinoma   . Bradycardia    relative  . CAD (coronary artery disease)   . Heart disease   .  Hyperlipidemia   . Myocardial infarction [facility]) 2007   has 2 stents  . PONV (postoperative nausea and vomiting)    problems yrears ago in 1963  . Seborrheic keratoses     His Past Surgical History Is Significant For: Past Surgical History:  Procedure Laterality Date  . back (disc),  1993  . CATARACT EXTRACTION Right 2016  . DOPPLER ECHOCARDIOGRAPHY    . GALLBLADDER  SURGERY  1994  . KNEE ARTHROPLASTY  1963/1990  . NM MYOVIEW LTD    . TOTAL KNEE ARTHROPLASTY Right 06/29/2016   Procedure: TOTAL KNEE ARTHROPLASTY;  Surgeon: [name], MD;  Location: [facility];  Service: Orthopedics;  Laterality: Right;    His Family History Is Significant For: History reviewed. No pertinent family history.  His Social History Is Significant For: Social History   Socioeconomic History  . Marital status: Married    Spouse name: Not on file  . Number of children: Not on file  . Years of education: Not on file  . Highest education level: Not on file  Occupational History  . Not on file  Social Needs  . Financial resource strain: Not on file  . Food insecurity:    Worry: Not on file    Inability: Not on file  . Transportation needs:    Medical: Not on file    Non-medical: Not on file  Tobacco Use  . Smoking status: Former Smoker    Years: 2.00    Types: Cigarettes  . Smokeless tobacco: Never Used  . Tobacco comment: smoked off and on during college  Substance and Sexual Activity  . Alcohol use: Not on file    Comment: occassionally  . Drug use: No  . Sexual activity: Not on file  Lifestyle  . Physical activity:    Days per week: Not on file    Minutes per session: Not on file  . Stress: Not on file  Relationships  . Social connections:    Talks on phone: Not on file    Gets together: Not on file    Attends religious service: Not on file    Active member of club or organization: Not on file    Attends meetings of clubs or organizations: Not on file    Relationship status: Not on file  Other Topics Concern  . Not on file  Social History Narrative  . Not on file    His Allergies Are:  Allergies  Allergen Reactions  . Amoxicillin   . Doxycycline   . Gatifloxacin   . Prednisone   :   His Current Medications Are:  Outpatient Encounter Medications as of 01/19/2018  Medication Sig  . acetaminophen (TYLENOL) 500 MG tablet Take 500 mg by mouth  every 6 (six) hours as needed (for pain/headache).  Marland Kitchen aspirin EC 81 MG tablet Take 81 mg by mouth 2 (two) times daily.  Marland Kitchen atorvastatin (LIPITOR) 10 MG tablet TAKE 1/2 TABLET BY MOUTH EVERY DAY  . Omega-3 Fatty Acids (FISH OIL) 1200 MG CAPS Take 1,200 mg by mouth daily.    No facility-administered encounter medications on file as of 01/19/2018.   :  Review of Systems:  Out of a complete 14 point review of systems, all are reviewed and negative with the exception of these symptoms as listed below: Review of Systems  Neurological:       Patient mentioned that he had a fall this morning trying to put his socks. He stated that he hit his elbow. He  also mentioned that his tremors have been bothering him since his fall.    Objective:  Neurological Exam  Physical Exam Physical Examination:   Vitals:   01/19/18 1020  BP: (!) 152/76  Pulse: (!) 53    General Examination: The patient is a very pleasant 72 y.o. male in no acute distress. He appears well-developed and well-nourished and well groomed.   HEENT: Normocephalic, atraumatic, pupils are equal, round and reactive to light and accommodation. Extraocular tracking is mildly impaired, he has mild facial masking. He has lens implants. Voice is mildly hypophonic, no voice tremor noted, he has mild to moderate nuchal rigidity. Hearing is grossly intact, airway examination is benign, mild mouth dryness noted, no tongue dyskinesias, no obvious sialorrhea.   Chest: Clear to auscultation without wheezing, rhonchi or crackles noted.  Heart: S1+S2+0, regular and normal without murmurs, rubs or gallops noted.   Abdomen: Soft, non-tender and non-distended with normal bowel sounds appreciated on auscultation.  Extremities: There is trace pitting edema in the distal lower extremities bilaterally, right more than left.  Skin: Warm and dry without trophic changes noted.  recent removal of basal cell skin cancer from the right knee.    Musculoskeletal: exam reveals no obvious joint deformities, tenderness or joint swelling or erythema, s/p right knee replacement surgery, right knee circumference larger than left.  Neurologically:  Mental status: The patient is awake, alert and oriented in all 4 spheres. His immediate and remote memory, attention, language skills and fund of knowledge are appropriate, perhaps mild bradyphrenia. There is no evidence of aphasia, agnosia, apraxia or anomia. Speech is clear with normal prosody and enunciation. Thought process is linear. Mood is normal and affect is normal.  Cranial nerves II - XII are as described above under HEENT exam. In addition: shoulder shrug is normal with equal shoulder height noted. Motor exam: Normal bulk, normal strength for age, mild increase in tone in both upper extremities. He has a mild intermittent resting tremor in the LUE.  (On 08/26/2017: On Archimedes spiral drawing he has no significant difficulty with the right, mild difficulty with left but not consistent with tremors, more in the realm of insecurity. Handwriting with the right is legible, mildly tremulous, not particularly micrographic.) On fine motor skill testing: he has mild difficulty with finger taps, hand movements, rapid alternating patting, foot taps and foot agility, left-side a little worse than right. He stands without difficulty and posture is mild to moderately stooped for age. He walks with decrease in stride length, decrease pace, decreased arm swing left more so than right. He has a little bit of difficulty with the right knee and foot turns outwards while walking.  Cerebellar testing: No dysmetria or intention tremor.  Sensory exam: intact to light touch in the upper and lower extremities.   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 He decided not to proceed with the DaT. We talked about different potential treatment options for symptomatic treatment including utilizing levodopa versus a dopamine agonist versus an enzyme inhibitor down the road. He is advised that we could proceed with a trial of Sinemet. He is not keen on  trying any new medication. I again had a long chat with the patient and his wife about His symptoms, my findings and the diagnosis of parkinsonism/Parksinson's disease, its prognosis and treatment options. We talked about medical treatments and non-pharmacological approaches. We talked about maintaining a healthy lifestyle in general. I encouraged the patient to eat healthy, exercise daily and keep well hydrated, to keep a scheduled bedtime and wake time routine, to not skip any meals and eat healthy snacks in between meals and to have protein with every meal. In particular, I stressed the importance of regular exercise, within of course the patient's own mobility limitations.   As far as further diagnostic testing is concerned, I suggested: we can reconsider a nuclear medicine scan and he should call us if he changes his mind. If he changes his mind about trying Sinemet he is also encouraged to call. We also talked about potentially seeking a second opinion at one of the academic centers such as to or [facility] or [facility]. They would like to think about it. Encouraged to discuss this with his PCP as well. I suggested a 6 month follow-up. I answered all their questions today and the patient and his wife were in agreement.  I spent 40 minutes in total face-to-face time with the patient, more than 50% of which was spent in counseling and coordination of  care, reviewing test results, reviewing medication and discussing or reviewing the diagnosis of PD, its prognosis and treatment options. Pertinent laboratory and imaging test results that were available during this visit with the patient were reviewed by me and considered in my medical decision making (see chart for details).

## 2018-01-21 DIAGNOSIS — Z23 Encounter for immunization: Secondary | ICD-10-CM | POA: Diagnosis not present

## 2018-01-24 DIAGNOSIS — Y92009 Unspecified place in unspecified non-institutional (private) residence as the place of occurrence of the external cause: Secondary | ICD-10-CM | POA: Diagnosis not present

## 2018-01-24 DIAGNOSIS — G20 Parkinson's disease: Secondary | ICD-10-CM | POA: Diagnosis not present

## 2018-01-24 DIAGNOSIS — S39012A Strain of muscle, fascia and tendon of lower back, initial encounter: Secondary | ICD-10-CM | POA: Diagnosis not present

## 2018-01-24 DIAGNOSIS — W19XXXA Unspecified fall, initial encounter: Secondary | ICD-10-CM | POA: Diagnosis not present

## 2018-02-21 ENCOUNTER — Telehealth: Payer: Self-pay | Admitting: Neurology

## 2018-02-21 NOTE — Telephone Encounter (Signed)
Patient and his wife have declined DAT scan Dr. Rexene Alberts is aware. Jayme Cloud

## 2018-03-29 ENCOUNTER — Other Ambulatory Visit: Payer: Self-pay | Admitting: Cardiovascular Disease

## 2018-04-01 DIAGNOSIS — Z85828 Personal history of other malignant neoplasm of skin: Secondary | ICD-10-CM | POA: Diagnosis not present

## 2018-04-01 DIAGNOSIS — D1801 Hemangioma of skin and subcutaneous tissue: Secondary | ICD-10-CM | POA: Diagnosis not present

## 2018-04-01 DIAGNOSIS — C4441 Basal cell carcinoma of skin of scalp and neck: Secondary | ICD-10-CM | POA: Diagnosis not present

## 2018-04-01 DIAGNOSIS — L570 Actinic keratosis: Secondary | ICD-10-CM | POA: Diagnosis not present

## 2018-04-01 DIAGNOSIS — L858 Other specified epidermal thickening: Secondary | ICD-10-CM | POA: Diagnosis not present

## 2018-04-01 DIAGNOSIS — L821 Other seborrheic keratosis: Secondary | ICD-10-CM | POA: Diagnosis not present

## 2018-04-01 DIAGNOSIS — D485 Neoplasm of uncertain behavior of skin: Secondary | ICD-10-CM | POA: Diagnosis not present

## 2018-06-27 ENCOUNTER — Other Ambulatory Visit: Payer: Self-pay | Admitting: Cardiovascular Disease

## 2018-07-12 ENCOUNTER — Telehealth: Payer: Self-pay | Admitting: Neurology

## 2018-07-12 NOTE — Telephone Encounter (Signed)
Due to current COVID 19 pandemic, our office is severely reducing in office visits until further notice, in order to minimize the risk to our patients and healthcare providers.   Called patient to offer a virtual visit for his 5/13 appointment. Patient declined because he does not have the resources to participate virtually. Patient accepted a telephone visit and understands to be prepared for calls from front office and doctor between 10 and 11 AM the day of. Patient is also aware he will receive a call from RN to update chart.   Pt understands that although there may be some limitations with this type of visit, we will take all precautions to reduce any security or privacy concerns.  Pt understands that this will be treated like an in office visit and we will file with pt's insurance, and there may be a patient responsible charge related to this service.

## 2018-07-18 NOTE — Telephone Encounter (Signed)
Patient's wife [name] calling because they have received an email from [facility] that reminds them of their appointment with Dr Rexene Alberts on 07/20/2018 that says it's in office visit. They have and want a telephone visit on 07/20/2018. Please call them at [phone] if this an issue.

## 2018-07-18 NOTE — Telephone Encounter (Signed)
I called pt's wife, per DPR. Pt and his wife declined an in office visit and a virtual visit. They are requesting a call at their home number at the appt date and time. Pt has declined to pursue DaT Scan testing at this time. Pt's meds, allergies, and PMH were updated.

## 2018-07-20 ENCOUNTER — Other Ambulatory Visit: Payer: Self-pay

## 2018-07-20 ENCOUNTER — Encounter: Payer: Self-pay | Admitting: Neurology

## 2018-07-20 ENCOUNTER — Telehealth: Payer: Self-pay

## 2018-07-20 ENCOUNTER — Ambulatory Visit (INDEPENDENT_AMBULATORY_CARE_PROVIDER_SITE_OTHER): Payer: PPO | Admitting: Neurology

## 2018-07-20 ENCOUNTER — Telehealth: Payer: Self-pay | Admitting: Neurology

## 2018-07-20 DIAGNOSIS — G4489 Other headache syndrome: Secondary | ICD-10-CM

## 2018-07-20 DIAGNOSIS — R51 Headache: Secondary | ICD-10-CM | POA: Diagnosis not present

## 2018-07-20 DIAGNOSIS — R519 Headache, unspecified: Secondary | ICD-10-CM

## 2018-07-20 DIAGNOSIS — G20 Parkinson's disease: Secondary | ICD-10-CM | POA: Diagnosis not present

## 2018-07-20 NOTE — Telephone Encounter (Signed)
health team order sent to GI. No auth they will reach out to the pt to schedule.  °

## 2018-07-20 NOTE — Progress Notes (Signed)
Interim history:  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  The patient is accompanied by his wife today and joins via home phone, I am located in my office.  I last saw him on 01/19/2018, at which time he reported a fall in the morning of the same day.  He had a superficial carpet burn on the elbow, he was not in favor of trying medication for parkinsonism, had declined the DaTscan.  He and his wife had been to PD support group meetings twice.  He was encouraged to consider trying Sinemet, he was also encouraged to reconsider pursuing the DaTscan, we also talked about the possibility of seeking a second opinion.  Today, 07/20/2018: Please also see below for virtual visit documentation.  The patient's allergies, current medications, family history, past medical history, past social history, past surgical history and problem list were reviewed and updated as appropriate.    Previously:   I first met him on 08/26/2017 at the request of his primary care physician, at which time he reported left hand tremors and gait difficulties. He had findings in keeping with parkinsonism with left-sided predominance noted. I suggested we proceed with a DaT scan.   08/26/2017: (He) reports an intermittent left hand tremors as well as problems with his gait. I reviewed your office note from 08/13/2017.  He noticed a intermittent left hand tremor probably around November 2018. He has not had any recent falls. He has had difficulty with his left knee, recent injection with cortisone has helped. He had a right total knee replacement last year in April 2018 which went well. He  had his surgery with Dr. Ronnie Derby. His injection was under Dr. Evelene Croon. He had recent cataract surgery in January 2019 on the left, he had some problems after his cataract surgery on the right in 2016. He reports that he has not been able to exercise and previously was really active in his exercise routine, he recently reenrolled at the [facility] with his wife.    He had a recent lumbar spine MRI without contrast on 05/14/2017 and I reviewed the results: IMPRESSION: L2-3: Endplate osteophytes and bulging of the disc more towards the left. Facet hypertrophy more on the left. Mild stenosis of the left lateral recess in the intervertebral foramen on the left. Definite neural compression is not demonstrated, but left-sided neural irritation could occur at this level.   Previous distance surgical changes at L3-4, L4-5 and L5-S1. Sufficient patency of the canal and foramina at those levels, with the single exception of some chronic appearing bony foraminal narrowing on the left at L5-S1 without distinct compression of the exiting left L5 nerve. This structure could be irritated.   He is married and lives with his wife. They have 3 children. He is a nonsmoker and does not typically utilize alcohol, animal caffeine is reported. He is retired, was a Government social research officer.   His Past Medical History Is Significant For: Past Medical History:  Diagnosis Date   Arthritis    Basal cell carcinoma    Bradycardia    relative   CAD (coronary artery disease)    Heart disease    Hyperlipidemia    Myocardial infarction [facility]) 2007  has 2 stents   PONV (postoperative nausea and vomiting)    problems yrears ago in 1963   Seborrheic keratoses     His Past Surgical History Is Significant For: Past Surgical History:  Procedure Laterality Date   back (disc),  1993   CATARACT EXTRACTION Right 2016   [place]   KNEE ARTHROPLASTY  1963/1990   NM MYOVIEW LTD      TOTAL KNEE ARTHROPLASTY Right 06/29/2016   Procedure: TOTAL KNEE ARTHROPLASTY;  Surgeon: [name], MD;  Location: [facility];  Service: Orthopedics;  Laterality: Right;    His Family History Is Significant For: No family history on file.  His Social History Is Significant For: Social History   Socioeconomic History   Marital status: Married    Spouse name: Not on file   Number of children: Not on file   Years of education: Not on file   Highest education level: Not on file  Occupational History   Not on file  Social Needs   Financial resource strain: Not on file   Food insecurity:    Worry: Not on file    Inability: Not on file   Transportation needs:    Medical: Not on file    Non-medical: Not on file  Tobacco Use   Smoking status: Former Smoker    Years: 2.00    Types: Cigarettes   Smokeless tobacco: Never Used   Tobacco comment: smoked off and on during college  Substance and Sexual Activity   Alcohol use: Not on file    Comment: occassionally   Drug use: No   Sexual activity: Not on file  Lifestyle   Physical activity:    Days per week: Not on file    Minutes per session: Not on file   Stress: Not on file  Relationships   Social connections:    Talks on phone: Not on file    Gets together: Not on file    Attends religious service: Not on file    Active member of club or organization: Not on file    Attends meetings of clubs or organizations: Not on file    Relationship status: Not on file  Other Topics Concern   Not on file  Social History Narrative   Not on file    His Allergies Are:  Allergies  Allergen Reactions   Amoxicillin    Doxycycline    Gatifloxacin    Prednisone   :   His Current Medications Are:  Outpatient Encounter Medications as of 07/20/2018  Medication Sig   acetaminophen (TYLENOL) 500 MG tablet Take 500 mg by mouth every 6 (six) hours as needed (for pain/headache).   aspirin EC 81 MG tablet Take 81 mg  by mouth 2 (two) times daily.   atorvastatin (LIPITOR) 10 MG tablet TAKE 1/2 TABLET BY MOUTH EVERY DAY   Omega-3 Fatty Acids (FISH OIL) 1200 MG CAPS Take 1,200 mg by mouth daily.    No facility-administered encounter medications on file as of 07/20/2018.   :  Review of Systems:  Out of a complete 14 point review of systems, all are reviewed and negative with the exception of these symptoms as listed below:  Virtual Visit via Telephone Note on 07/20/18:   I connected with@ on 07/20/18 at 10:30 AM EDT by telephone and verified that I am speaking with the correct person using two identifiers.   I discussed the limitations, risks, security and privacy  concerns of performing an evaluation and management service by telephone and the availability of in person appointments. I also discussed with the patient that there may be a patient responsible charge related to this service. The patient expressed understanding and agreed to proceed.   History of Present Illness:  He reports overall doing fairly well, he has had a decline in his exercise routine unfortunately because of the temporary closures of [place] and at [employer].  He is hoping to get started on a walking routine again.  He denies any recent acute illnesses.  Overall, he feels fairly stable with the exception that he has noticed the occasional flareup in his left hand tremor and perhaps his left upper extremity stiffness is somewhat worse compared to before.  He is still reluctant to consider any new medications, he is not keen on pursuing the DaTscan quite yet especially in light of the virus pandemic.  He does report new onset intermittent milder headaches on the right side, particularly in the top and frontal area.  He is not prone to headaches typically.  Headaches happen about twice a week, not severe, does not always require medication but he has taken Tylenol, 2 extra strength pills at times.  He denies any other neurological symptoms, does  report he has been working outside some more and has had some allergy symptoms and wonders if this is sinus related.   Observations/Objective:  He is very pleasant and conversant, no acute distress, good comprehension and good orientation noted.  Speech is not dysarthric, no voice tremor noted, he does have a little raspiness in his voice and mild hypophonia is noted.  He is able to relate his history very well. Seems to be in good spirits.   Assessment and Plan:  In summary,Najeh R Dillonis a very pleasant 70 year oldmalewith an underlying medical history of heart disease with status post MI, history of bradycardia, basal cell cancer, arthritis, status post left knee injection and status post right knee replacement, hyperlipidemia, and mildly overweight state, whopresents for Phone based, virtual follow-up appointment of his parkinsonism, some left-sided lateralization noted by exam previously and also by symptoms.  He reports feeling fairly stable, he is still not in favor of starting any symptomatic medication and He is reassured that we can certainly wait and revisit this at our next visit.  I suggested a 33-monthfollow-up since we have not had a face-to-face visit since November 2019.  Nevertheless, in the interim, I would like for him to work on getting back on track with a form of regular exercise regimen, particularly in form of walking when possible.  He has noticed in the past 6 weeks some intermittent milder headaches on the right side especially in the right frontal area and probably in the top, parietal area.  He is typically not prone to headaches and does endorse some flareup in symptoms, nonetheless I would like to proceed with a brain MRI with and without contrast, since he has never had a brain scan and it would be good to have a look at any structural possible causes for his new onset headaches even though they have been milder and he does not report any new neurological  accompaniments.  He is advised that we can call him to schedule the MRI when it is considered safe to bring him in for scanning, this is not an emergency or urgent test.  I would like to be able to get this done before our next appointment.  He  is agreeable.  We mutually agreed to delay the DaTscan and delay starting any symptomatic medication for parkinsonism such as Sinemet.  He is encouraged to call with any interim questions.  I answered all his questions today and he was in agreement with the plan.   Follow Up Instructions: 1. MRI brain w/wo contrast.  2. FU 3 months 3. Consider DaT scan.  4. Consider Sinemet.   I discussed the assessment and treatment plan with the patient. The patient was provided an opportunity to ask questions and all were answered. The patient agreed with the plan and demonstrated an understanding of the instructions.   The patient was advised to call back or seek an in-person evaluation if the symptoms worsen or if the condition fails to improve as anticipated.   I provided 12 minutes of non-face-to-face time during this encounter.   Star Age, MD

## 2018-07-20 NOTE — Telephone Encounter (Signed)
I called pt, scheduled his f/u. Pt verbalized understanding of new appt date and time.

## 2018-07-20 NOTE — Patient Instructions (Signed)
Given over the phone during today's phone call virtual visit.  

## 2018-07-21 ENCOUNTER — Telehealth: Payer: Self-pay | Admitting: Cardiovascular Disease

## 2018-07-21 DIAGNOSIS — H5203 Hypermetropia, bilateral: Secondary | ICD-10-CM | POA: Diagnosis not present

## 2018-07-21 DIAGNOSIS — H40013 Open angle with borderline findings, low risk, bilateral: Secondary | ICD-10-CM | POA: Diagnosis not present

## 2018-07-21 NOTE — Telephone Encounter (Signed)
New Message   Patient returning your call to get pre reg.

## 2018-07-21 NOTE — Telephone Encounter (Signed)
Home phone/ my chart/ consent/ pre reg completed °

## 2018-07-22 ENCOUNTER — Encounter: Payer: Self-pay | Admitting: Cardiovascular Disease

## 2018-07-22 ENCOUNTER — Other Ambulatory Visit: Payer: Self-pay

## 2018-07-22 ENCOUNTER — Telehealth (INDEPENDENT_AMBULATORY_CARE_PROVIDER_SITE_OTHER): Payer: PPO | Admitting: Cardiovascular Disease

## 2018-07-22 ENCOUNTER — Telehealth: Payer: Self-pay

## 2018-07-22 DIAGNOSIS — E782 Mixed hyperlipidemia: Secondary | ICD-10-CM | POA: Diagnosis not present

## 2018-07-22 DIAGNOSIS — I25810 Atherosclerosis of coronary artery bypass graft(s) without angina pectoris: Secondary | ICD-10-CM

## 2018-07-22 NOTE — Progress Notes (Signed)
Virtual Visit via Telephone Note   This visit type was conducted due to national recommendations for restrictions regarding the COVID-19 Pandemic (e.g. social distancing) in an effort to limit this patient's exposure and mitigate transmission in our community.  Due to his co-morbid illnesses, this patient is at least at moderate risk for complications without adequate follow up.  This format is felt to be most appropriate for this patient at this time.  The patient did not have access to video technology/had technical difficulties with video requiring transitioning to audio format only (telephone).  All issues noted in this document were discussed and addressed.  No physical exam could be performed with this format.  Please refer to the patient's chart for his  consent to telehealth for [facility] Marvin Ingram.   Date:  07/22/2018   ID:  [name], DOB [birth year], MRN [id number]  Patient Location: Home Provider Location: Home  PCP:  [name], MD  Cardiologist: [name] Electrophysiologist:  None   Evaluation Performed:  Follow-Up Visit  Chief Complaint: 1 year follow-up CAD  History of Present Illness:    Marvin Ingram is a 73 y.o.  fit-appearing married Caucasian male father of 3 children, grandfather, 5 grandchildren formerly a patient of [name]. 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 He does have one son in the [employer] who lives in [place] , [place].his primary care physician is [name]. He has a history of coronary artery disease status post non-ST segment elevation myocardial infarction April 2007 with cardiac catheterization performed by [name] 4/6/7 revealing high-grade mid and distal LAD disease which were intervened on with Taxus drug-eluting stents. His RCA and circumflex were free of significant disease and he had normal LV function. He  smoked remotely and stopped in 1965. He has treated hyperlipidemia at goal for secondary prevention on low-dose atorvastatin. His most recent lipid profile performed 04/27/2017 revealed a total cholesterol 129, LDL 66 and HDL 43.  Otherwise, [facility] since [facility]-19 he has not been exercising, and denies chest pain or shortness of breath. Hehadnoticed new onset of palpitations occurring on a daily basis over the last several monthsprior to his last office visit.HeDoes admit to drinking caffeinated tea which she has since stopped and his palpitations have resolved. He had a right total knee replacement by Dr. Augustin Coupe 06/29/16 and now has pain in his left knee.  Since I saw him a year ago he is done well.  He is sheltering in place and socially distancing with his wife [name].  Unfortunately, his Parkinson's disease is somewhat progressed with regards to his tremor.  He does see a neurologist for this.   The patient does not have symptoms concerning for COVID-19 infection (fever, chills, cough, or new shortness of breath).    Past Medical History:  Diagnosis Date  . Arthritis   . Basal cell carcinoma   . Bradycardia    relative  . CAD (coronary artery disease)   . Heart disease   . Hyperlipidemia   . Myocardial infarction [facility]) 2007   has 2 stents  . PONV (postoperative nausea and vomiting)    problems yrears ago in 1963  . Seborrheic keratoses    Past Surgical History:  Procedure Laterality Date  . back (disc),  1993  . CATARACT EXTRACTION  Right 2016  . DOPPLER ECHOCARDIOGRAPHY    . GALLBLADDER SURGERY  1994  . KNEE ARTHROPLASTY  1963/1990  . NM MYOVIEW LTD    . TOTAL KNEE ARTHROPLASTY Right 06/29/2016   Procedure: TOTAL KNEE ARTHROPLASTY;  Surgeon: [name], MD;  Location: [facility];  Service: Orthopedics;  Laterality: Right;     No outpatient medications have been marked as taking for the 07/22/18 encounter (Appointment) with  Lorretta Harp, MD.     Allergies:   Amoxicillin; Doxycycline; Gatifloxacin; and Prednisone   Social History   Tobacco Use  . Smoking status: Former Smoker    Years: 2.00    Types: Cigarettes  . Smokeless tobacco: Never Used  . Tobacco comment: smoked off and on during college  Substance Use Topics  . Alcohol use: Not on file    Comment: occassionally  . Drug use: No     Family Hx: The patient's family history is not on file.  ROS:   Please see the history of present illness.     All other systems reviewed and are negative.   Prior CV studies:   The following studies were reviewed today:  None  Labs/Other Tests and Data Reviewed:    EKG:  No ECG reviewed.  Recent Labs: No results found for requested labs within last 8760 hours.   Recent Lipid Panel Lab Results  Component Value Date/Time   CHOL 129 04/27/2017 08:12 AM   TRIG 99 04/27/2017 08:12 AM   HDL 43 04/27/2017 08:12 AM   CHOLHDL 3.0 04/27/2017 08:12 AM   CHOLHDL 3.4 01/14/2016 08:02 AM   LDLCALC 66 04/27/2017 08:12 AM    Wt Readings from Last 3 Encounters:  01/19/18 181 lb (82.1 kg)  08/26/17 176 lb (79.8 kg)  05/04/17 186 lb 6.4 oz (84.6 kg)     Objective:    Vital Signs:  There were no vitals taken for this visit.   VITAL SIGNS:  reviewed the patient was unable to take his blood own blood pressure.  A full exam was not performed since this was a virtual telemedicine phone visit  ASSESSMENT & PLAN:    1. Coronary artery disease- history of CAD status post non-STEMI 4/07 treated with PCI and drug-eluting stenting of the mid and distal LAD by [name].  Taxus drug-eluting stents were used.  His RCA and circumflex were okay and he had normal LV function.  He said no recurrent symptoms. 2. Hyperlipidemia- history of hyperlipidemia on low-dose atorvastatin with lipid profile performed 04/27/2017 revealing total cholesterol 129, LDL 66 and HDL 43 3. Palpitations- occasional palpitations but  these are fairly rare.  COVID-19 Education: The signs and symptoms of COVID-19 were discussed with the patient and how to seek care for testing (follow up with PCP or arrange E-visit).  The importance of social distancing was discussed today.  Time:   Today, I have spent 5 minutes with the patient with telehealth technology discussing the above problems.     Medication Adjustments/Labs and Tests Ordered: Current medicines are reviewed at length with the patient today.  Concerns regarding medicines are outlined above.   Tests Ordered: No orders of the defined types were placed in this encounter.   Medication Changes: No orders of the defined types were placed in this encounter.   Disposition:  Follow up in 1 year(s)  Signed, Quay Burow, MD  07/22/2018 8:00 AM    [place]

## 2018-07-22 NOTE — Telephone Encounter (Signed)
Patient and/or DPR-approved person aware of AVS instructions and verbalized understanding. AVS RELEASED TO [facility]

## 2018-07-22 NOTE — Patient Instructions (Signed)

## 2018-09-19 ENCOUNTER — Other Ambulatory Visit: Payer: Self-pay | Admitting: Cardiovascular Disease

## 2018-10-10 ENCOUNTER — Other Ambulatory Visit: Payer: PPO

## 2018-10-11 DIAGNOSIS — Z23 Encounter for immunization: Secondary | ICD-10-CM | POA: Diagnosis not present

## 2018-10-11 DIAGNOSIS — Z0000 Encounter for general adult medical examination without abnormal findings: Secondary | ICD-10-CM | POA: Diagnosis not present

## 2018-10-26 ENCOUNTER — Ambulatory Visit: Payer: Self-pay | Admitting: Neurology

## 2019-01-11 DIAGNOSIS — Z23 Encounter for immunization: Secondary | ICD-10-CM | POA: Diagnosis not present

## 2019-02-13 DIAGNOSIS — L84 Corns and callosities: Secondary | ICD-10-CM | POA: Diagnosis not present

## 2019-02-13 DIAGNOSIS — L600 Ingrowing nail: Secondary | ICD-10-CM | POA: Diagnosis not present

## 2019-08-09 DIAGNOSIS — Z85828 Personal history of other malignant neoplasm of skin: Secondary | ICD-10-CM | POA: Diagnosis not present

## 2019-08-09 DIAGNOSIS — L820 Inflamed seborrheic keratosis: Secondary | ICD-10-CM | POA: Diagnosis not present

## 2019-08-09 DIAGNOSIS — L570 Actinic keratosis: Secondary | ICD-10-CM | POA: Diagnosis not present

## 2019-08-09 DIAGNOSIS — L821 Other seborrheic keratosis: Secondary | ICD-10-CM | POA: Diagnosis not present

## 2019-08-09 DIAGNOSIS — D044 Carcinoma in situ of skin of scalp and neck: Secondary | ICD-10-CM | POA: Diagnosis not present

## 2019-08-09 DIAGNOSIS — D1801 Hemangioma of skin and subcutaneous tissue: Secondary | ICD-10-CM | POA: Diagnosis not present

## 2019-10-31 DIAGNOSIS — L821 Other seborrheic keratosis: Secondary | ICD-10-CM | POA: Diagnosis not present

## 2019-10-31 DIAGNOSIS — L570 Actinic keratosis: Secondary | ICD-10-CM | POA: Diagnosis not present

## 2019-10-31 DIAGNOSIS — L84 Corns and callosities: Secondary | ICD-10-CM | POA: Diagnosis not present

## 2019-10-31 DIAGNOSIS — Z85828 Personal history of other malignant neoplasm of skin: Secondary | ICD-10-CM | POA: Diagnosis not present

## 2019-12-27 DIAGNOSIS — Z23 Encounter for immunization: Secondary | ICD-10-CM | POA: Diagnosis not present

## 2019-12-27 DIAGNOSIS — M79675 Pain in left toe(s): Secondary | ICD-10-CM | POA: Diagnosis not present

## 2019-12-27 DIAGNOSIS — L84 Corns and callosities: Secondary | ICD-10-CM | POA: Diagnosis not present

## 2020-03-19 DIAGNOSIS — Z85828 Personal history of other malignant neoplasm of skin: Secondary | ICD-10-CM | POA: Diagnosis not present

## 2020-03-19 DIAGNOSIS — L929 Granulomatous disorder of the skin and subcutaneous tissue, unspecified: Secondary | ICD-10-CM | POA: Diagnosis not present

## 2020-07-19 DIAGNOSIS — L308 Other specified dermatitis: Secondary | ICD-10-CM | POA: Diagnosis not present

## 2020-07-19 DIAGNOSIS — Z85828 Personal history of other malignant neoplasm of skin: Secondary | ICD-10-CM | POA: Diagnosis not present

## 2020-07-19 DIAGNOSIS — L309 Dermatitis, unspecified: Secondary | ICD-10-CM | POA: Diagnosis not present

## 2020-10-03 DIAGNOSIS — C44719 Basal cell carcinoma of skin of left lower limb, including hip: Secondary | ICD-10-CM | POA: Diagnosis not present

## 2020-10-03 DIAGNOSIS — L570 Actinic keratosis: Secondary | ICD-10-CM | POA: Diagnosis not present

## 2020-10-03 DIAGNOSIS — L821 Other seborrheic keratosis: Secondary | ICD-10-CM | POA: Diagnosis not present

## 2020-10-03 DIAGNOSIS — D225 Melanocytic nevi of trunk: Secondary | ICD-10-CM | POA: Diagnosis not present

## 2020-10-03 DIAGNOSIS — Z85828 Personal history of other malignant neoplasm of skin: Secondary | ICD-10-CM | POA: Diagnosis not present

## 2020-10-17 DIAGNOSIS — Z0000 Encounter for general adult medical examination without abnormal findings: Secondary | ICD-10-CM | POA: Diagnosis not present

## 2020-10-23 ENCOUNTER — Emergency Department (HOSPITAL_COMMUNITY): Payer: PPO

## 2020-10-23 ENCOUNTER — Inpatient Hospital Stay (HOSPITAL_COMMUNITY)
Admission: EM | Admit: 2020-10-23 | Discharge: 2020-11-03 | DRG: 179 | Disposition: A | Discharge status: Skilled Nursing Facility | Payer: PPO | Attending: Internal Medicine | Admitting: Internal Medicine

## 2020-10-23 ENCOUNTER — Encounter (HOSPITAL_COMMUNITY): Payer: Self-pay | Admitting: Internal Medicine

## 2020-10-23 ENCOUNTER — Inpatient Hospital Stay (HOSPITAL_COMMUNITY): Payer: PPO

## 2020-10-23 ENCOUNTER — Other Ambulatory Visit: Payer: Self-pay

## 2020-10-23 DIAGNOSIS — Z7982 Long term (current) use of aspirin: Secondary | ICD-10-CM | POA: Diagnosis not present

## 2020-10-23 DIAGNOSIS — Z79899 Other long term (current) drug therapy: Secondary | ICD-10-CM | POA: Diagnosis not present

## 2020-10-23 DIAGNOSIS — K5909 Other constipation: Secondary | ICD-10-CM | POA: Diagnosis not present

## 2020-10-23 DIAGNOSIS — R531 Weakness: Secondary | ICD-10-CM | POA: Diagnosis not present

## 2020-10-23 DIAGNOSIS — R260 Ataxic gait: Secondary | ICD-10-CM | POA: Diagnosis not present

## 2020-10-23 DIAGNOSIS — M25559 Pain in unspecified hip: Secondary | ICD-10-CM

## 2020-10-23 DIAGNOSIS — Z87891 Personal history of nicotine dependence: Secondary | ICD-10-CM

## 2020-10-23 DIAGNOSIS — L219 Seborrheic dermatitis, unspecified: Secondary | ICD-10-CM | POA: Diagnosis present

## 2020-10-23 DIAGNOSIS — G20 Parkinson's disease: Secondary | ICD-10-CM | POA: Diagnosis present

## 2020-10-23 DIAGNOSIS — M1990 Unspecified osteoarthritis, unspecified site: Secondary | ICD-10-CM | POA: Diagnosis present

## 2020-10-23 DIAGNOSIS — I2510 Atherosclerotic heart disease of native coronary artery without angina pectoris: Secondary | ICD-10-CM | POA: Diagnosis not present

## 2020-10-23 DIAGNOSIS — Z881 Allergy status to other antibiotic agents status: Secondary | ICD-10-CM | POA: Diagnosis not present

## 2020-10-23 DIAGNOSIS — R41841 Cognitive communication deficit: Secondary | ICD-10-CM | POA: Diagnosis not present

## 2020-10-23 DIAGNOSIS — Z20828 Contact with and (suspected) exposure to other viral communicable diseases: Secondary | ICD-10-CM | POA: Diagnosis not present

## 2020-10-23 DIAGNOSIS — I252 Old myocardial infarction: Secondary | ICD-10-CM

## 2020-10-23 DIAGNOSIS — R404 Transient alteration of awareness: Secondary | ICD-10-CM | POA: Diagnosis not present

## 2020-10-23 DIAGNOSIS — M7062 Trochanteric bursitis, left hip: Secondary | ICD-10-CM | POA: Diagnosis not present

## 2020-10-23 DIAGNOSIS — E785 Hyperlipidemia, unspecified: Secondary | ICD-10-CM | POA: Diagnosis present

## 2020-10-23 DIAGNOSIS — Z66 Do not resuscitate: Secondary | ICD-10-CM | POA: Diagnosis present

## 2020-10-23 DIAGNOSIS — M6281 Muscle weakness (generalized): Secondary | ICD-10-CM | POA: Diagnosis not present

## 2020-10-23 DIAGNOSIS — I25810 Atherosclerosis of coronary artery bypass graft(s) without angina pectoris: Secondary | ICD-10-CM | POA: Diagnosis not present

## 2020-10-23 DIAGNOSIS — F0280 Dementia in other diseases classified elsewhere without behavioral disturbance: Secondary | ICD-10-CM | POA: Diagnosis present

## 2020-10-23 DIAGNOSIS — Z85828 Personal history of other malignant neoplasm of skin: Secondary | ICD-10-CM | POA: Diagnosis not present

## 2020-10-23 DIAGNOSIS — Z888 Allergy status to other drugs, medicaments and biological substances status: Secondary | ICD-10-CM | POA: Diagnosis not present

## 2020-10-23 DIAGNOSIS — Z955 Presence of coronary angioplasty implant and graft: Secondary | ICD-10-CM | POA: Diagnosis not present

## 2020-10-23 DIAGNOSIS — J069 Acute upper respiratory infection, unspecified: Secondary | ICD-10-CM

## 2020-10-23 DIAGNOSIS — R070 Pain in throat: Secondary | ICD-10-CM | POA: Diagnosis not present

## 2020-10-23 DIAGNOSIS — M25552 Pain in left hip: Secondary | ICD-10-CM | POA: Diagnosis not present

## 2020-10-23 DIAGNOSIS — U071 COVID-19: Principal | ICD-10-CM | POA: Diagnosis present

## 2020-10-23 DIAGNOSIS — R001 Bradycardia, unspecified: Secondary | ICD-10-CM | POA: Diagnosis present

## 2020-10-23 DIAGNOSIS — Z96651 Presence of right artificial knee joint: Secondary | ICD-10-CM | POA: Diagnosis present

## 2020-10-23 DIAGNOSIS — M1612 Unilateral primary osteoarthritis, left hip: Secondary | ICD-10-CM | POA: Diagnosis not present

## 2020-10-23 DIAGNOSIS — M25551 Pain in right hip: Secondary | ICD-10-CM | POA: Diagnosis not present

## 2020-10-23 DIAGNOSIS — R509 Fever, unspecified: Secondary | ICD-10-CM | POA: Diagnosis not present

## 2020-10-23 DIAGNOSIS — R278 Other lack of coordination: Secondary | ICD-10-CM | POA: Diagnosis not present

## 2020-10-23 DIAGNOSIS — R600 Localized edema: Secondary | ICD-10-CM | POA: Diagnosis not present

## 2020-10-23 DIAGNOSIS — Z0389 Encounter for observation for other suspected diseases and conditions ruled out: Secondary | ICD-10-CM | POA: Diagnosis not present

## 2020-10-23 DIAGNOSIS — R2681 Unsteadiness on feet: Secondary | ICD-10-CM | POA: Diagnosis not present

## 2020-10-23 HISTORY — DX: COVID-19: U07.1

## 2020-10-23 LAB — RESP PANEL BY RT-PCR (FLU A&B, COVID) ARPGX2
Influenza A by PCR: NEGATIVE
Influenza B by PCR: NEGATIVE
SARS Coronavirus 2 by RT PCR: POSITIVE — AB

## 2020-10-23 LAB — URINALYSIS, ROUTINE W REFLEX MICROSCOPIC
Bacteria, UA: NONE SEEN
Bilirubin Urine: NEGATIVE
Glucose, UA: NEGATIVE mg/dL
Ketones, ur: 5 mg/dL — AB
Leukocytes,Ua: NEGATIVE
Nitrite: NEGATIVE
Protein, ur: NEGATIVE mg/dL
Specific Gravity, Urine: 1.006 (ref 1.005–1.030)
pH: 6 (ref 5.0–8.0)

## 2020-10-23 LAB — CBC WITH DIFFERENTIAL/PLATELET
Abs Immature Granulocytes: 0.01 10*3/uL (ref 0.00–0.07)
Basophils Absolute: 0 10*3/uL (ref 0.0–0.1)
Basophils Relative: 0 %
Eosinophils Absolute: 0 10*3/uL (ref 0.0–0.5)
Eosinophils Relative: 0 %
HCT: 37.6 % — ABNORMAL LOW (ref 39.0–52.0)
Hemoglobin: 13 g/dL (ref 13.0–17.0)
Immature Granulocytes: 0 %
Lymphocytes Relative: 13 %
Lymphs Abs: 0.6 10*3/uL — ABNORMAL LOW (ref 0.7–4.0)
MCH: 33.2 pg (ref 26.0–34.0)
MCHC: 34.6 g/dL (ref 30.0–36.0)
MCV: 95.9 fL (ref 80.0–100.0)
Monocytes Absolute: 0.7 10*3/uL (ref 0.1–1.0)
Monocytes Relative: 14 %
Neutro Abs: 3.6 10*3/uL (ref 1.7–7.7)
Neutrophils Relative %: 73 %
Platelets: 125 10*3/uL — ABNORMAL LOW (ref 150–400)
RBC: 3.92 MIL/uL — ABNORMAL LOW (ref 4.22–5.81)
RDW: 12.3 % (ref 11.5–15.5)
WBC: 4.9 10*3/uL (ref 4.0–10.5)
nRBC: 0 % (ref 0.0–0.2)

## 2020-10-23 LAB — RESPIRATORY PANEL BY PCR

## 2020-10-23 LAB — COMPREHENSIVE METABOLIC PANEL
ALT: 12 U/L (ref 0–44)
AST: 49 U/L — ABNORMAL HIGH (ref 15–41)
Albumin: 3 g/dL — ABNORMAL LOW (ref 3.5–5.0)
Alkaline Phosphatase: 50 U/L (ref 38–126)
Anion gap: 10 (ref 5–15)
BUN: 13 mg/dL (ref 8–23)
CO2: 23 mmol/L (ref 22–32)
Calcium: 8.3 mg/dL — ABNORMAL LOW (ref 8.9–10.3)
Chloride: 102 mmol/L (ref 98–111)
Creatinine, Ser: 1.24 mg/dL (ref 0.61–1.24)
GFR, Estimated: 60 mL/min — ABNORMAL LOW (ref >60)
Glucose, Bld: 98 mg/dL (ref 70–99)
Potassium: 3.6 mmol/L (ref 3.5–5.1)
Sodium: 135 mmol/L (ref 135–145)
Total Bilirubin: 0.8 mg/dL (ref 0.3–1.2)
Total Protein: 5.9 g/dL — ABNORMAL LOW (ref 6.5–8.1)

## 2020-10-23 LAB — LACTIC ACID, PLASMA: Lactic Acid, Venous: 1 mmol/L (ref 0.5–1.9)

## 2020-10-23 LAB — PROTIME-INR
INR: 1 (ref 0.8–1.2)
Prothrombin Time: 12.9 seconds (ref 11.4–15.2)

## 2020-10-23 LAB — APTT: aPTT: 36 seconds (ref 24–36)

## 2020-10-23 MED ORDER — THIAMINE HCL 100 MG PO TABS
100.0000 mg | ORAL_TABLET | Freq: Every day | ORAL | Status: DC
  Administered 2020-10-23 – 2020-11-03 (×12): 100 mg via ORAL
  Filled 2020-10-23 (×12): qty 1

## 2020-10-23 MED ORDER — SODIUM CHLORIDE 0.9% FLUSH
3.0000 mL | Freq: Two times a day (BID) | INTRAVENOUS | Status: DC
  Administered 2020-10-23 – 2020-11-01 (×11): 3 mL via INTRAVENOUS

## 2020-10-23 MED ORDER — GUAIFENESIN-DM 100-10 MG/5ML PO SYRP: 10.0000 mL | ORAL_SOLUTION | ORAL | Status: DC | PRN

## 2020-10-23 MED ORDER — LACTATED RINGERS IV BOLUS (SEPSIS)
1000.0000 mL | Freq: Once | INTRAVENOUS | Status: AC
  Administered 2020-10-23: 1000 mL via INTRAVENOUS

## 2020-10-23 MED ORDER — HYDROCOD POLST-CPM POLST ER 10-8 MG/5ML PO SUER: 5.0000 mL | Freq: Two times a day (BID) | ORAL | Status: DC | PRN

## 2020-10-23 MED ORDER — ACETAMINOPHEN 650 MG RE SUPP: 650.0000 mg | Freq: Four times a day (QID) | RECTAL | Status: DC | PRN

## 2020-10-23 MED ORDER — ADULT MULTIVITAMIN W/MINERALS CH
1.0000 | ORAL_TABLET | Freq: Every day | ORAL | Status: DC
  Administered 2020-10-23 – 2020-11-03 (×12): 1 via ORAL
  Filled 2020-10-23 (×12): qty 1

## 2020-10-23 MED ORDER — NIRMATRELVIR/RITONAVIR (PAXLOVID)TABLET
3.0000 | ORAL_TABLET | Freq: Two times a day (BID) | ORAL | Status: DC
  Filled 2020-10-23: qty 30

## 2020-10-23 MED ORDER — NIRMATRELVIR/RITONAVIR (PAXLOVID) TABLET (RENAL DOSING)
2.0000 | ORAL_TABLET | Freq: Two times a day (BID) | ORAL | Status: AC
  Administered 2020-10-23 – 2020-10-27 (×10): 2 via ORAL
  Filled 2020-10-23 (×2): qty 20

## 2020-10-23 MED ORDER — ENOXAPARIN SODIUM 40 MG/0.4ML IJ SOSY
40.0000 mg | PREFILLED_SYRINGE | INTRAMUSCULAR | Status: DC
  Administered 2020-10-23 – 2020-11-02 (×11): 40 mg via SUBCUTANEOUS
  Filled 2020-10-23 (×12): qty 0.4

## 2020-10-23 MED ORDER — ACETAMINOPHEN 325 MG PO TABS
650.0000 mg | ORAL_TABLET | Freq: Four times a day (QID) | ORAL | Status: DC | PRN
  Administered 2020-10-24 – 2020-10-26 (×4): 650 mg via ORAL
  Filled 2020-10-23 (×4): qty 2

## 2020-10-23 MED ORDER — FOLIC ACID 1 MG PO TABS
1.0000 mg | ORAL_TABLET | Freq: Every day | ORAL | Status: DC
  Administered 2020-10-23 – 2020-11-03 (×12): 1 mg via ORAL
  Filled 2020-10-23 (×12): qty 1

## 2020-10-23 NOTE — ED Notes (Signed)
XR requesting info on pt.

## 2020-10-23 NOTE — H&P (Signed)
Date: 10/23/2020               Patient Name:  Marvin Ingram MRN: [id number]  DOB: Mar 25, 1945 Age / Sex: 75 y.o., male   PCP: [name], MD         Medical Service: Internal Medicine Teaching Service         Attending Physician: Dr. Sid Falcon, MD    First Contact: Lajean Manes, MD Pager: [place] [phone]  Second Contact: Virl Axe, MD Pager: Sandrea Hammond [phone]       After Hours (After 5p/  First Contact Pager: [phone]  weekends / holidays): Second Contact Pager: [phone]   SUBJECTIVE  Chief Complaint: weakness  History of Present Illness: Marvin Ingram is a 75 y.o. male with a pertinent PMH of Parkinson's dementia and CAD s/p NSTEMI in 2007 with DES to LAD, hyperlipidemia, basal cell carcinoma, arthritis s/p R knee replacement, and bradycardia who presents to [facility] with one week of productive cough, sore throat, congestion and generalized weakness. History obtained by patient and chart review. He reports feeling well up until one week prior when he started feeling weaker with worsening gait and left sided hip pain. He denies any falls. He reports decreased oral intake during this time as well. He notes two days of worsening cough, sore throat and noted to have Tmax of 103 at home; although reports that the thermometer may have been broken. He did take tylenol around 11pm last night. He also endorses an occipital headache without any trauma. He denies any vision changes, chest pain, shortness of breath, abdominal pain, nausea/vomiting or diarrhea.   Patient was noted to be febrile with Tmax 102.1 with EMS, otherwise hemodynamically stable. CBC without leukocytosis. No significant electrolyte abnormalities noted; did have a mildly elevated AST to 49. Lactic acid 1.0. Urinalysis with mild ketonuria and small hemoglobinuria without any bacteruria, leukocytes or nitrites. CXR negative for any focal pneumonia. Patient noted to be COVID positive and admitted for further management.    Medications: No current facility-administered medications on file prior to encounter.   Current Outpatient Medications on File Prior to Encounter  Medication Sig Dispense Refill   acetaminophen (TYLENOL) 500 MG tablet Take 500 mg by mouth every 6 (six) hours as needed (for pain/headache).     aspirin EC 81 MG tablet Take 81 mg by mouth 2 (two) times daily.     atorvastatin (LIPITOR) 10 MG tablet TAKE 1/2 TABLET BY MOUTH EVERY DAY (Patient taking differently: Take 5 mg by mouth daily.) 45 tablet 3   Omega-3 Fatty Acids (FISH OIL) 1200 MG CAPS Take 1,200 mg by mouth daily.       Past Medical History:  Past Medical History:  Diagnosis Date   Arthritis    Basal cell carcinoma    Bradycardia    relative   CAD (coronary artery disease)    Heart disease    Hyperlipidemia    Myocardial infarction [facility]) 2007   has 2 stents   PONV (postoperative nausea and vomiting)    problems yrears ago in 1963   Seborrheic keratoses     Social:  Patient lives at home with his wife. He has three sons and multiple grand children. He is a retired Government social research officer. He is relatively functional at baseline and notes that he is usually able to go grocery shopping and complete ADL's. He follows with Dr Kathryne Eriksson. He has a remote history of tobacco use but quit in 1965. He denies  any alcohol use or illicit drug use.   Family History: No family history on file. Patient is unable to provide at this time .  Allergies: Allergies as of 10/23/2020 - Review Complete 07/22/2018  Allergen Reaction Noted   Amoxicillin  08/25/2017   Doxycycline  08/25/2017   Gatifloxacin  08/25/2017   Prednisone  08/25/2017    Review of Systems: A complete ROS was negative except as per HPI.   OBJECTIVE:  Physical Exam: Blood pressure 119/70, pulse (!) 58, temperature 98.9 F (37.2 C), temperature source Oral, resp. rate 17, height 5' 9.5" (1.765 m), weight 81.6 kg, SpO2 97 %. Physical Exam  Constitutional: Chronically  ill appearing elderly male, no acute distress  HENT: Normocephalic and atraumatic, EOMI, moist mucous membranes Cardiovascular: Normal rate, regular rhythm, S1 and S2 present, no murmurs, rubs, gallops.  Distal pulses intact.  Respiratory: No respiratory distress, no accessory muscle use. Lungs are clear to auscultation bilaterally. GI: Nondistended, soft, nontender to palpation, normal bowel sounds Musculoskeletal: Normal bulk; cogwheel rigidity noted in bilateral upper extremities. No peripheral edema noted. Neurological: Is alert and oriented x4, strength 5/5 in BLE, 5/5 in RLE, 3/5 in LLE Skin: Warm and dry.  Multiple chronic lesions noted on scalp (?seborrheic keratosis). No rash or erythema noted.    Pertinent Labs: CBC    Component Value Date/Time   WBC 4.9 10/23/2020 0452   RBC 3.92 (L) 10/23/2020 0452   HGB 13.0 10/23/2020 0452   HCT 37.6 (L) 10/23/2020 0452   PLT 125 (L) 10/23/2020 0452   MCV 95.9 10/23/2020 0452   MCH 33.2 10/23/2020 0452   MCHC 34.6 10/23/2020 0452   RDW 12.3 10/23/2020 0452   LYMPHSABS 0.6 (L) 10/23/2020 0452   MONOABS 0.7 10/23/2020 0452   EOSABS 0.0 10/23/2020 0452   BASOSABS 0.0 10/23/2020 0452     CMP     Component Value Date/Time   NA 135 10/23/2020 0452   K 3.6 10/23/2020 0452   CL 102 10/23/2020 0452   CO2 23 10/23/2020 0452   GLUCOSE 98 10/23/2020 0452   BUN 13 10/23/2020 0452   CREATININE 1.24 10/23/2020 0452   CREATININE 1.31 (H) 01/14/2016 0802   CALCIUM 8.3 (L) 10/23/2020 0452   PROT 5.9 (L) 10/23/2020 0452   ALBUMIN 3.0 (L) 10/23/2020 0452   AST 49 (H) 10/23/2020 0452   ALT 12 10/23/2020 0452   ALKPHOS 50 10/23/2020 0452   BILITOT 0.8 10/23/2020 0452   GFRNONAA 60 (L) 10/23/2020 0452   GFRAA >60 06/30/2016 0511    Pertinent Imaging: DG Chest Port 1 View  Result Date: 10/23/2020 CLINICAL DATA:  Questionable sepsis EXAM: PORTABLE CHEST 1 VIEW COMPARISON:  06/11/2005 FINDINGS: Normal heart size and stable mediastinal  contours. Coronary stenting. There is no edema, consolidation, effusion, or pneumothorax. IMPRESSION: No acute finding.  No focal pneumonia. Electronically Signed   By: Monte Fantasia M.D.   On: 10/23/2020 05:46   DG HIP UNILAT WITH PELVIS 2-3 VIEWS LEFT  Result Date: 10/23/2020 CLINICAL DATA:  Left hip pain EXAM: DG HIP (WITH OR WITHOUT PELVIS) 2-3V LEFT COMPARISON:  05/06/2017 FINDINGS: There is no evidence of hip fracture or dislocation. Mild bilateral hip joint space narrowing there is no evidence of other focal bone abnormality. IMPRESSION: Negative. Electronically Signed   By: Davina Poke D.O.   On: 10/23/2020 12:15    EKG: personally reviewed my interpretation is unchanged from previous tracings, sinus bradycardia  ASSESSMENT & PLAN:  Assessment: Active Problems:   COVID-19  virus infection   BUCK ILGENFRITZ is a 75 y.o. with pertinent PMH of Parkinson's dementia, CAD s/p NSTEMI with DES in 2007,hyperlipidemia, basal cell carcinoma who presented with sore throat, cough, generalized weakness and fever and admit for COVID-19 infection on hospital day 0  Plan: #COVID 19 Infection Patient is admitted with approximately 1 week of generalized weakness, sore throat, cough, worsening gait ataxia.  These may have been acutely worsening over the past 48 hours.  Noted to have a T-max of 102.1 with EMS.  No significant leukocytosis on labs and no further febrile episodes in the ED. no lactic acidosis noted on labs.  Chest x-ray without any focal pneumonia at this time.  Patient is noted to be COVID-positive.  Suspect his symptoms are secondary to COVID infection at this time. Given advanced age and Parkinson's dementia with prior history of CAD, at risk for decompensation. - Start Paxlovid x5 days - Tussionex 59m q12h prn and Robitussin DM [id number] prn - Encourage ICS and flutter valve - Oxygen support prn for SpO2 >92% - F/u blood cultures  - Trending inflammatory markers   #Parkinson's  dementia #Physical deconditioning #L Hip pain  Patient has a history of Parkinson's dementia and notes worsening gait ataxia with overall generalized weakness. He is endorsing some left hip pain although denies any trauma or recent falls. On exam, does have decreased strength in the LLE compared to RLE.  - F/u L hip X-ray - PT/OT eval  #Hx of CAD s/p NSTEMI w/DES  #Hyperlipidemia - Continue aspirin '81mg'$  daily - Continue lipitor '5mg'$  daily   Best Practice: Diet: Cardiac diet IVF: Fluids: IV push only, no IV fluids, Rate: None VTE: enoxaparin (LOVENOX) injection 40 mg Start: 10/23/20 0830 Code: DNR Status: Inpatient with expected length of stay greater than 2 midnights. Anticipated Discharge Location: SNF Barriers to Discharge: Decreased caregiver support and Medical stability  Signature: SaHarvie HeckMD Internal Medicine Resident, PGY-3 [facility]  Pager: #[phone] PM, 10/23/2020   Please contact the on call pager after 5 pm and on weekends at [phone]

## 2020-10-23 NOTE — Progress Notes (Signed)
Pt arrived to 5W 05 from ED. Pt oriented to room and connected to monitor. No new c/o. Bed locked and low, call bell w/in reach.  10/23/20 1419  Vitals  Temp 98.1 F (36.7 C)  Temp Source Oral  BP (!) 152/83  MAP (mmHg) 105  Pulse Rate 77  ECG Heart Rate 84  Resp 18  MEWS COLOR  MEWS Score Color Green  Oxygen Therapy  SpO2 96 %  O2 Device Room Air  MEWS Score  MEWS Temp 0  MEWS Systolic 0  MEWS Pulse 0  MEWS RR 0  MEWS LOC 0  MEWS Score 0

## 2020-10-23 NOTE — ED Provider Notes (Signed)
75 yo male w/ parkinson's disease (advanced) presenting with URI type symptoms from home, wife is also sick.  Reporting sore throat, cough, fever, congestion at home.  Symptom onset 48 hours ago.  Labs near baseline.  Pending Covid swab.  Anticipate medical admission.  0730 - admitted to hospital IM service Dr Marva Panda.   Langston Masker, Carola Rhine, MD 10/23/20 [id number]

## 2020-10-23 NOTE — ED Provider Notes (Signed)
[facility] EMERGENCY DEPARTMENT Provider Note   CSN: [id number] Arrival date & time: 10/23/20  0445     History Chief Complaint  Patient presents with   Fever   Sore Throat   Fatigue    Marvin Ingram is a 75 y.o. male.  75 yo M with a chief complaints of cough sore throat fatigue and fever.  This been going on for couple days after they had sick contact visits a few days before that.  Denies nausea or vomiting.  Has become very weak and fatigued and is not able to take care of himself like he typically can.  His wife is also ill.  The history is provided by the patient and the EMS personnel.  Fever Associated symptoms: congestion, cough and sore throat   Associated symptoms: no chest pain, no chills, no confusion, no diarrhea, no headaches, no myalgias, no rash and no vomiting   Sore Throat Pertinent negatives include no chest pain, no abdominal pain, no headaches and no shortness of breath.  Illness Severity:  Moderate Onset quality:  Gradual Duration:  2 days Timing:  Constant Progression:  Worsening Chronicity:  New Associated symptoms: congestion, cough, fever and sore throat   Associated symptoms: no abdominal pain, no chest pain, no diarrhea, no headaches, no myalgias, no rash, no shortness of breath and no vomiting       Past Medical History:  Diagnosis Date   Arthritis    Basal cell carcinoma    Bradycardia    relative   CAD (coronary artery disease)    Heart disease    Hyperlipidemia    Myocardial infarction ([place]) 2007   has 2 stents   PONV (postoperative nausea and vomiting)    problems yrears ago in 1963   Seborrheic keratoses     Patient Active Problem List   Diagnosis Date Noted   COVID-19 virus infection 10/23/2020   S/P total knee replacement 06/29/2016   Palpitations 01/23/2016   CAD (coronary artery disease) of artery bypass graft 01/19/2014   Hyperlipidemia 01/19/2014    Past Surgical History:  Procedure Laterality  Date   back (disc),  1993   CATARACT EXTRACTION Right 2016   [place]   KNEE ARTHROPLASTY  1963/1990   NM MYOVIEW LTD     TOTAL KNEE ARTHROPLASTY Right 06/29/2016   Procedure: TOTAL KNEE ARTHROPLASTY;  Surgeon: [name], MD;  Location: [place];  Service: Orthopedics;  Laterality: Right;       No family history on file.  Social History   Tobacco Use   Smoking status: Former    Years: 2.00    Types: Cigarettes   Smokeless tobacco: Never   Tobacco comments:    smoked off and on during college  Substance Use Topics   Drug use: No    Home Medications Prior to Admission medications   Medication Sig Start Date End Date Taking? Authorizing Provider  acetaminophen (TYLENOL) 500 MG tablet Take 500 mg by mouth every 6 (six) hours as needed (for pain/headache).    [provider]  aspirin EC 81 MG tablet Take 81 mg by mouth 2 (two) times daily.    [provider]  atorvastatin (LIPITOR) 10 MG tablet TAKE 1/2 TABLET BY MOUTH EVERY DAY Patient taking differently: Take 5 mg by mouth daily. 09/19/18   Lorretta Harp, MD  Omega-3 Fatty Acids (FISH OIL) 1200 MG CAPS Take 1,200 mg by mouth daily.  [provider]    Allergies    Amoxicillin, Doxycycline, Gatifloxacin, and Prednisone  Review of Systems   Review of Systems  Constitutional:  Positive for fever. Negative for chills.  HENT:  Positive for congestion and sore throat. Negative for facial swelling.   Eyes:  Negative for discharge and visual disturbance.  Respiratory:  Positive for cough. Negative for shortness of breath.   Cardiovascular:  Negative for chest pain and palpitations.  Gastrointestinal:  Negative for abdominal pain, diarrhea and vomiting.  Musculoskeletal:  Negative for arthralgias and myalgias.  Skin:  Negative for color change and rash.  Neurological:  Negative for tremors, syncope and headaches.  Psychiatric/Behavioral:  Negative for  confusion and dysphoric mood.    Physical Exam Updated Vital Signs BP 113/77 (BP Location: Right Arm)   Pulse (!) 53   Temp 98.5 F (36.9 C) (Axillary)   Resp 19   Ht 5' 9.5" (1.765 m)   Wt 81.6 kg   SpO2 92%   BMI 26.19 kg/m   Physical Exam Vitals and nursing note reviewed.  Constitutional:      Appearance: He is well-developed.  HENT:     Head: Normocephalic and atraumatic.  Eyes:     Pupils: Pupils are equal, round, and reactive to light.  Neck:     Vascular: No JVD.  Cardiovascular:     Rate and Rhythm: Normal rate and regular rhythm.     Heart sounds: No murmur heard.   No friction rub. No gallop.  Pulmonary:     Effort: No respiratory distress.     Breath sounds: No wheezing.  Abdominal:     General: There is no distension.     Tenderness: There is no abdominal tenderness. There is no guarding or rebound.  Musculoskeletal:        General: Normal range of motion.     Cervical back: Normal range of motion and neck supple.  Skin:    Coloration: Skin is not pale.     Findings: No rash.  Neurological:     Mental Status: He is alert and oriented to person, place, and time.  Psychiatric:        Behavior: Behavior normal.    ED Results / Procedures / Treatments   Labs (all labs ordered are listed, but only abnormal results are displayed) Labs Reviewed  RESP PANEL BY RT-PCR (FLU A&B, COVID) ARPGX2 - Abnormal; Notable for the following components:      Result Value   SARS Coronavirus 2 by RT PCR POSITIVE (*)    All other components within normal limits  COMPREHENSIVE METABOLIC PANEL - Abnormal; Notable for the following components:   Calcium 8.3 (*)    Total Protein 5.9 (*)    Albumin 3.0 (*)    AST 49 (*)    GFR, Estimated 60 (*)    All other components within normal limits  CBC WITH DIFFERENTIAL/PLATELET - Abnormal; Notable for the following components:   RBC 3.92 (*)    HCT 37.6 (*)    Platelets 125 (*)    Lymphs Abs 0.6 (*)    All other components  within normal limits  URINALYSIS, ROUTINE W REFLEX MICROSCOPIC - Abnormal; Notable for the following components:   Color, Urine STRAW (*)    Hgb urine dipstick SMALL (*)    Ketones, ur 5 (*)    Non Squamous Epithelial 0-5 (*)    All other components within normal limits  CULTURE, BLOOD (ROUTINE X 2)  CULTURE,  BLOOD (ROUTINE X 2)  RESPIRATORY PANEL BY PCR  URINE CULTURE  LACTIC ACID, PLASMA  PROTIME-INR  APTT  TSH  BASIC METABOLIC PANEL  CBC  MAGNESIUM  PHOSPHORUS  C-REACTIVE PROTEIN  D-DIMER, QUANTITATIVE  FERRITIN    EKG EKG Interpretation  Date/Time:  Wednesday October 23 2020 05:13:09 EDT Ventricular Rate:  58 PR Interval:    QRS Duration: 104 QT Interval:  466 QTC Calculation: 458 R Axis:   21 Text Interpretation: Sinus bradycardia Minimal ST depression, lateral leads No significant change since last tracing Confirmed by Deno Etienne [phone]) on 10/23/2020 5:23:26 AM  Radiology DG Chest Port 1 View  Result Date: 10/23/2020 CLINICAL DATA:  Questionable sepsis EXAM: PORTABLE CHEST 1 VIEW COMPARISON:  06/11/2005 FINDINGS: Normal heart size and stable mediastinal contours. Coronary stenting. There is no edema, consolidation, effusion, or pneumothorax. IMPRESSION: No acute finding.  No focal pneumonia. Electronically Signed   By: Monte Fantasia M.D.   On: 10/23/2020 05:46   DG HIP UNILAT WITH PELVIS 2-3 VIEWS LEFT  Result Date: 10/23/2020 CLINICAL DATA:  Left hip pain EXAM: DG HIP (WITH OR WITHOUT PELVIS) 2-3V LEFT COMPARISON:  05/06/2017 FINDINGS: There is no evidence of hip fracture or dislocation. Mild bilateral hip joint space narrowing there is no evidence of other focal bone abnormality. IMPRESSION: Negative. Electronically Signed   By: Davina Poke D.O.   On: 10/23/2020 12:15    Procedures Procedures   Medications Ordered in ED Medications  enoxaparin (LOVENOX) injection 40 mg (40 mg Subcutaneous Given 10/23/20 1150)  sodium chloride flush (NS) 0.9 % injection  3 mL (3 mLs Intravenous Given 10/23/20 2113)  acetaminophen (TYLENOL) tablet 650 mg (has no administration in time range)    Or  acetaminophen (TYLENOL) suppository 650 mg (has no administration in time range)  guaiFENesin-dextromethorphan (ROBITUSSIN DM) 100-10 MG/5ML syrup 10 mL (has no administration in time range)  chlorpheniramine-HYDROcodone (TUSSIONEX) 10-8 MG/5ML suspension 5 mL (has no administration in time range)  thiamine tablet 100 mg (100 mg Oral Given 10/23/20 1150)  multivitamin with minerals tablet 1 tablet (1 tablet Oral Given [id number])  folic acid (FOLVITE) tablet 1 mg (1 mg Oral Given 10/23/20 1150)  nirmatrelvir/ritonavir EUA (renal dosing) (PAXLOVID) TABS 2 tablet (2 tablets Oral Given 10/23/20 2114)  lactated ringers bolus 1,000 mL (0 mLs Intravenous Stopped 10/23/20 [id number])    ED Course  I have reviewed the triage vital signs and the nursing notes.  Pertinent labs & imaging results that were available during my care of the patient were reviewed by me and considered in my medical decision making (see chart for details).    MDM Rules/Calculators/A&P                           75 yo M with a chief complaints of cough congestion sore throat fever going on for couple days now.  Patient's wife is also sick.  He normally is independent and able to perform all of his ADLs but has gotten to the point where he is unable to do anything for himself.  Was found to be quite altered with EMS but improved in route.  Lab work-up here thus far is unremarkable chest x-ray viewed by me without focal infiltrate UA negative for infection.  Patient still very weak.  I feel he is not a good candidate to go home.  Will discuss with medicine for admission.  Signed out to Dr. Langston Masker, please see his note  for further details care in the ED.  The patients results and plan were reviewed and discussed.   Any x-rays performed were independently reviewed by myself.   Differential diagnosis were  considered with the presenting HPI.  Medications  enoxaparin (LOVENOX) injection 40 mg (40 mg Subcutaneous Given 10/23/20 1150)  sodium chloride flush (NS) 0.9 % injection 3 mL (3 mLs Intravenous Given 10/23/20 2113)  acetaminophen (TYLENOL) tablet 650 mg (has no administration in time range)    Or  acetaminophen (TYLENOL) suppository 650 mg (has no administration in time range)  guaiFENesin-dextromethorphan (ROBITUSSIN DM) 100-10 MG/5ML syrup 10 mL (has no administration in time range)  chlorpheniramine-HYDROcodone (TUSSIONEX) 10-8 MG/5ML suspension 5 mL (has no administration in time range)  thiamine tablet 100 mg (100 mg Oral Given 10/23/20 1150)  multivitamin with minerals tablet 1 tablet (1 tablet Oral Given [id number])  folic acid (FOLVITE) tablet 1 mg (1 mg Oral Given 10/23/20 1150)  nirmatrelvir/ritonavir EUA (renal dosing) (PAXLOVID) TABS 2 tablet (2 tablets Oral Given 10/23/20 2114)  lactated ringers bolus 1,000 mL (0 mLs Intravenous Stopped 10/23/20 0632)    Vitals:   10/23/20 1419 10/23/20 1700 10/23/20 1927 10/23/20 2357  BP: (!) 152/83 120/74 96/72 113/77  Pulse: 77 63 (!) 59 (!) 53  Resp: '18 18 18 19  '$ Temp: 98.1 F (36.7 C) 98.3 F (36.8 C) 99.4 F (37.4 C) 98.5 F (36.9 C)  TempSrc: Oral Oral Oral Axillary  SpO2: 96% 96% 96% 92%  Weight:      Height:        Final diagnoses:  Viral upper respiratory tract infection  Hip pain  COVID-19 virus infection     Final Clinical Impression(s) / ED Diagnoses Final diagnoses:  Viral upper respiratory tract infection  Hip pain  COVID-19 virus infection    Rx / DC Orders ED Discharge Orders     None        Deno Etienne, DO 10/24/20 0136

## 2020-10-23 NOTE — ED Notes (Signed)
RN updated pt's son, Erlene Quan, and daughter in Sports coach, Anderson Malta.

## 2020-10-23 NOTE — ED Triage Notes (Signed)
Patient arrives via EMS from home due to worsening cough/sore throat/fatigue and fever. EMS states that the patient's family had visitor who was sick after leaving (did not have covid). Patient took tylenol for fever around 11pm.  Patient has a history of parkinson's.   EMS vitals:  102.1 Temp HR: 60 BP: 142/84 Patient a&ox 4

## 2020-10-23 NOTE — ED Notes (Signed)
Pt placed on bedpan, pads and brief changed, new condom cath placed. Pt changed into gown

## 2020-10-23 NOTE — Progress Notes (Signed)
PHARMACY NOTE:  Paxlovid Initiation  Patient being start on Paxlovid, renally dose adjusted, on 10/23/20. Per package insert, lipid modifying agent, atorvastatin (Lipitor), should be withheld and resume once Paxlovid therapy is completed.   Per package insert: Consider temporary discontinuation of atorvastatin and rosuvastatin during treatment with PAXLOVID. Atorvastatin and rosuvastatin do not need to be held prior to or after completing PAXLOVID.  Thank you for allowing pharmacy to be a part of this patient's care.  Joetta Manners, PharmD, [facility] Emergency Medicine Clinical Pharmacist ED RPh Phone: [place]: [phone]

## 2020-10-23 NOTE — ED Notes (Signed)
ED TO INPATIENT HANDOFF REPORT  ED Nurse Name and Phone #:  [phone]  S Name/Age/Gender Marvin Ingram 75 y.o. male Room/Bed: 016C/016C  Code Status   Code Status: DNR  Home/SNF/Other Home Patient oriented to: self, place, time and situation Is this baseline? Yes   Triage Complete: Triage complete  Chief Complaint COVID-19 virus infection [U07.1]  Triage Note Patient arrives via EMS from home due to worsening cough/sore throat/fatigue and fever. EMS states that the patient's family had visitor who was sick after leaving (did not have covid). Patient took tylenol for fever around 11pm.  Patient has a history of parkinson's.   EMS vitals:  102.1 Temp HR: 60 BP: 142/84 Patient a&ox 4    Allergies Allergies  Allergen Reactions  . Amoxicillin   . Doxycycline   . Gatifloxacin   . Prednisone     Level of Care/Admitting Diagnosis ED Disposition    ED Disposition  Admit   Condition  --   [place]: [place] [[id number]]  Level of Care: Telemetry Medical [104]  May admit patient to [facility] or [facility] if equivalent level of care is available:: No  Covid Evaluation: Symptomatic Person Under Investigation (PUI)  Diagnosis: COVID-19 virus infection [id number]  Admitting Physician: Sid Falcon [phone]  Attending Physician: Sid Falcon [phone]  Estimated length of stay: past midnight tomorrow  Certification:: I certify this patient will need inpatient services for at least 2 midnights         B Medical/Surgery History Past Medical History:  Diagnosis Date  . Arthritis   . Basal cell carcinoma   . Bradycardia    relative  . CAD (coronary artery disease)   . Heart disease   . Hyperlipidemia   . Myocardial infarction [facility]) 2007   has 2 stents  . PONV (postoperative nausea and vomiting)    problems yrears ago in 1963  . Seborrheic keratoses    Past Surgical History:  Procedure Laterality Date  . back (disc),   1993  . CATARACT EXTRACTION Right 2016  . DOPPLER ECHOCARDIOGRAPHY    . GALLBLADDER SURGERY  1994  . KNEE ARTHROPLASTY  1963/1990  . NM MYOVIEW LTD    . TOTAL KNEE ARTHROPLASTY Right 06/29/2016   Procedure: TOTAL KNEE ARTHROPLASTY;  Surgeon: [name], MD;  Location: [facility];  Service: Orthopedics;  Laterality: Right;     A IV Location/Drains/Wounds Patient Lines/Drains/Airways Status    Active Line/Drains/Airways    Name Placement date Placement time Site Days   Peripheral IV 10/23/20 18 G Left Forearm 10/23/20  0459  Forearm  less than 1   External Urinary Catheter 10/23/20  1139  --  less than 1          Intake/Output Last 24 hours  Intake/Output Summary (Last 24 hours) at 10/23/2020 1243 Last data filed at 10/23/2020 1146 Gross per 24 hour  Intake 1000 ml  Output 150 ml  Net 850 ml    Labs/Imaging Results for orders placed or performed during the hospital encounter of 10/23/20 (from the past 48 hour(s))  Lactic acid, plasma     Status: None   Collection Time: 10/23/20  4:52 AM  Result Value Ref Range   Lactic Acid, Venous 1.0 0.5 - 1.9 mmol/L    Comment: Performed at [facility], [address] [place], [place] [address]  Comprehensive metabolic panel     Status: Abnormal   Collection Time: 10/23/20  4:52 AM  Result Value Ref Range   Sodium 135 135 - 145 mmol/L   Potassium 3.6 3.5 - 5.1 mmol/L   Chloride 102 98 - 111 mmol/L   CO2 23 22 - 32 mmol/L   Glucose, Bld 98 70 - 99 mg/dL    Comment: Glucose reference range applies only to samples taken after fasting for at least 8 hours.   BUN 13 8 - 23 mg/dL   Creatinine, Ser 1.24 0.61 - 1.24 mg/dL   Calcium 8.3 (L) 8.9 - 10.3 mg/dL   Total Protein 5.9 (L) 6.5 - 8.1 g/dL   Albumin 3.0 (L) 3.5 - 5.0 g/dL   AST 49 (H) 15 - 41 U/L   ALT 12 0 - 44 U/L   Alkaline Phosphatase 50 38 - 126 U/L   Total Bilirubin 0.8 0.3 - 1.2 mg/dL   GFR, Estimated 60 (L) >60 mL/min    Comment: (NOTE) Calculated using the CKD-EPI  Creatinine Equation (2021)    Anion gap 10 5 - 15    Comment: Performed at [facility], [place] [address] [place], [place] [address]  CBC WITH DIFFERENTIAL     Status: Abnormal   Collection Time: 10/23/20  4:52 AM  Result Value Ref Range   WBC 4.9 4.0 - 10.5 K/uL   RBC 3.92 (L) 4.22 - 5.81 MIL/uL   Hemoglobin 13.0 13.0 - 17.0 g/dL   HCT 37.6 (L) 39.0 - 52.0 %   MCV 95.9 80.0 - 100.0 fL   MCH 33.2 26.0 - 34.0 pg   MCHC 34.6 30.0 - 36.0 g/dL   RDW 12.3 11.5 - 15.5 %   Platelets 125 (L) 150 - 400 K/uL    Comment: REPEATED TO VERIFY   nRBC 0.0 0.0 - 0.2 %   Neutrophils Relative % 73 %   Neutro Abs 3.6 1.7 - 7.7 K/uL   Lymphocytes Relative 13 %   Lymphs Abs 0.6 (L) 0.7 - 4.0 K/uL   Monocytes Relative 14 %   Monocytes Absolute 0.7 0.1 - 1.0 K/uL   Eosinophils Relative 0 %   Eosinophils Absolute 0.0 0.0 - 0.5 K/uL   Basophils Relative 0 %   Basophils Absolute 0.0 0.0 - 0.1 K/uL   Immature Granulocytes 0 %   Abs Immature Granulocytes 0.01 0.00 - 0.07 K/uL    Comment: Performed at [facility], [place] [address] [place], [place] [address]  Protime-INR     Status: None   Collection Time: 10/23/20  4:52 AM  Result Value Ref Range   Prothrombin Time 12.9 11.4 - 15.2 seconds   INR 1.0 0.8 - 1.2    Comment: (NOTE) INR goal varies based on device and disease states. Performed at [facility], [place] [address] [place], [place] [address]   APTT     Status: None   Collection Time: 10/23/20  4:52 AM  Result Value Ref Range   aPTT 36 24 - 36 seconds    Comment: Performed at [facility] [address], [place],  [address]  Urinalysis, Routine w reflex microscopic Urine, Clean Catch     Status: Abnormal   Collection Time: 10/23/20  4:52 AM  Result Value Ref Range   Color, Urine STRAW (A) YELLOW   APPearance CLEAR CLEAR   Specific Gravity, Urine 1.006 1.005 - 1.030   pH 6.0 5.0 - 8.0   Glucose, UA NEGATIVE NEGATIVE mg/dL   Hgb urine dipstick SMALL (A)  NEGATIVE   Bilirubin  Urine NEGATIVE NEGATIVE   Ketones, ur 5 (A) NEGATIVE mg/dL   Protein, ur NEGATIVE NEGATIVE mg/dL   Nitrite NEGATIVE NEGATIVE   Leukocytes,Ua NEGATIVE NEGATIVE   RBC / HPF 6-10 0 - 5 RBC/hpf   WBC, UA 0-5 0 - 5 WBC/hpf   Bacteria, UA NONE SEEN NONE SEEN   Non Squamous Epithelial 0-5 (A) NONE SEEN    Comment: Performed at [facility], [place] [address], [place], [place] [address]  Blood Culture (routine x 2)     Status: None (Preliminary result)   Collection Time: 10/23/20  5:30 AM   Specimen: BLOOD LEFT FOREARM  Result Value Ref Range   Specimen Description BLOOD LEFT FOREARM    Special Requests      BOTTLES DRAWN AEROBIC AND ANAEROBIC Blood Culture adequate volume   Culture      NO GROWTH < 12 HOURS Performed at [facility], [place] [address] [place], [place] [address]    Report Status PENDING   Blood Culture (routine x 2)     Status: None (Preliminary result)   Collection Time: 10/23/20  5:30 AM   Specimen: BLOOD RIGHT FOREARM  Result Value Ref Range   Specimen Description BLOOD RIGHT FOREARM    Special Requests      BOTTLES DRAWN AEROBIC AND ANAEROBIC Blood Culture results may not be optimal due to an inadequate volume of blood received in culture bottles   Culture      NO GROWTH < 12 HOURS Performed at [facility], [place] [address] [place], [place] [address]    Report Status PENDING   Resp Panel by RT-PCR (Flu A&B, Covid) Nasopharyngeal Swab     Status: Abnormal   Collection Time: 10/23/20  6:32 AM   Specimen: Nasopharyngeal Swab; Nasopharyngeal(NP) swabs in vial transport medium  Result Value Ref Range   SARS Coronavirus 2 by RT PCR POSITIVE (A) NEGATIVE    Comment: RESULT CALLED TO, READ BACK BY AND VERIFIED WITH: Sh[place]one RN 10/23/2020 '@0843'$  BY JW (NOTE) SARS-CoV-2 target nucleic acids are DETECTED.  The SARS-CoV-2 RNA is generally detectable in upper respiratory specimens during the acute phase of infection. Positive results  are indicative of the presence of the identified virus, but do not rule out bacterial infection or co-infection with other pathogens not detected by the test. Clinical correlation with patient history and other diagnostic information is necessary to determine patient infection status. The expected result is Negative.  Fact Sheet for Patients: EntrepreneurPulse.com.au  Fact Sheet for Healthcare Providers: IncredibleEmployment.be  This test is not yet approved or cleared by the [place] FDA and  has been authorized for detection and/or diagnosis of SARS-CoV-2 by FDA under an Emergency Use Authorization (EUA).  This EUA will remain in effect (meaning this test can  be used) for the duration of  the COVID-19 declaration under Section 564(b)(1) of the Act, 21 U.S.C. section 360bbb-3(b)(1), unless the authorization is terminated or revoked sooner.     Influenza A by PCR NEGATIVE NEGATIVE   Influenza B by PCR NEGATIVE NEGATIVE    Comment: (NOTE) The Xpert Xpress SARS-CoV-2/FLU/RSV plus assay is intended as an aid in the diagnosis of influenza from Nasopharyngeal swab specimens and should not be used as a sole basis for treatment. Nasal washings and aspirates are unacceptable for Xpert Xpress SARS-CoV-2/FLU/RSV testing.  Fact Sheet for Patients: EntrepreneurPulse.com.au  Fact Sheet for Healthcare Providers: IncredibleEmployment.be  This test is not yet approved or cleared by the [place]  States FDA and has been authorized for detection and/or diagnosis of SARS-CoV-2 by FDA under an Emergency Use Authorization (EUA). This EUA will remain in effect (meaning this test can be used) for the duration of the COVID-19 declaration under Section 564(b)(1) of the Act, 21 U.S.C. section 360bbb-3(b)(1), unless the authorization is terminated or revoked.  Performed at [facility], [place] [address] [place],  [place] [address]   Respiratory (~20 pathogens) panel by PCR     Status: None   Collection Time: 10/23/20  7:38 AM   Specimen: Nasopharyngeal Swab; Respiratory  Result Value Ref Range   Adenovirus NOT DETECTED NOT DETECTED   Coronavirus 229E NOT DETECTED NOT DETECTED    Comment: (NOTE) The Coronavirus on the Respiratory Panel, DOES NOT test for the novel  Coronavirus (2019 nCoV)    Coronavirus HKU1 NOT DETECTED NOT DETECTED   Coronavirus NL63 NOT DETECTED NOT DETECTED   Coronavirus OC43 NOT DETECTED NOT DETECTED   Metapneumovirus NOT DETECTED NOT DETECTED   Rhinovirus / Enterovirus NOT DETECTED NOT DETECTED   Influenza A NOT DETECTED NOT DETECTED   Influenza B NOT DETECTED NOT DETECTED   Parainfluenza Virus 1 NOT DETECTED NOT DETECTED   Parainfluenza Virus 2 NOT DETECTED NOT DETECTED   Parainfluenza Virus 3 NOT DETECTED NOT DETECTED   Parainfluenza Virus 4 NOT DETECTED NOT DETECTED   Respiratory Syncytial Virus NOT DETECTED NOT DETECTED   Bordetella pertussis NOT DETECTED NOT DETECTED   Bordetella Parapertussis NOT DETECTED NOT DETECTED   Chlamydophila pneumoniae NOT DETECTED NOT DETECTED   Mycoplasma pneumoniae NOT DETECTED NOT DETECTED    Comment: Performed at [facility], [place] [address], [place], [place] [address]   DG Chest Port 1 View  Result Date: 10/23/2020 CLINICAL DATA:  Questionable sepsis EXAM: PORTABLE CHEST 1 VIEW COMPARISON:  06/11/2005 FINDINGS: Normal heart size and stable mediastinal contours. Coronary stenting. There is no edema, consolidation, effusion, or pneumothorax. IMPRESSION: No acute finding.  No focal pneumonia. Electronically Signed   By: Monte Fantasia M.D.   On: 10/23/2020 05:46   DG HIP UNILAT WITH PELVIS 2-3 VIEWS LEFT  Result Date: 10/23/2020 CLINICAL DATA:  Left hip pain EXAM: DG HIP (WITH OR WITHOUT PELVIS) 2-3V LEFT COMPARISON:  05/06/2017 FINDINGS: There is no evidence of hip fracture or dislocation. Mild bilateral hip joint space narrowing  there is no evidence of other focal bone abnormality. IMPRESSION: Negative. Electronically Signed   By: Davina Poke D.O.   On: 10/23/2020 12:15    Pending Labs Unresulted Labs (From admission, onward)    Start     Ordered   10/24/20 0500  TSH  Tomorrow morning,   R        10/23/20 0826   10/24/20 [id number]  Basic metabolic panel  Tomorrow morning,   R        10/23/20 0826   10/24/20 0500  CBC  Tomorrow morning,   R        10/23/20 0826   10/24/20 0500  Magnesium  Daily,   R      10/23/20 1129   10/24/20 0500  Phosphorus  Daily,   R      10/23/20 1129   10/24/20 0500  C-reactive protein  Tomorrow morning,   R        10/23/20 1131   10/24/20 0500  D-dimer, quantitative  Tomorrow morning,   R        10/23/20 1131   10/24/20 0500  Ferritin  Tomorrow morning,   R        10/23/20 1131   10/23/20 0452  Urine Culture  (Undifferentiated presentation (screening labs and basic nursing orders))  ONCE - STAT,   STAT       Question:  Indication  Answer:  Sepsis   10/23/20 0452          Vitals/Pain Today's Vitals   10/23/20 1145 10/23/20 1150 10/23/20 1200 10/23/20 1215  BP: 133/69  118/73 119/70  Pulse: 63  (!) 58 (!) 58  Resp: (!) '21  16 17  '$ Temp:  98.9 F (37.2 C)    TempSrc:  Oral    SpO2: 99%  98% 97%  Weight:      Height:      PainSc:        Isolation Precautions Airborne and Contact precautions  Medications Medications  enoxaparin (LOVENOX) injection 40 mg (40 mg Subcutaneous Given 10/23/20 1150)  sodium chloride flush (NS) 0.9 % injection 3 mL (3 mLs Intravenous Not Given 10/23/20 1103)  acetaminophen (TYLENOL) tablet 650 mg (has no administration in time range)    Or  acetaminophen (TYLENOL) suppository 650 mg (has no administration in time range)  guaiFENesin-dextromethorphan (ROBITUSSIN DM) 100-10 MG/5ML syrup 10 mL (has no administration in time range)  chlorpheniramine-HYDROcodone (TUSSIONEX) 10-8 MG/5ML suspension 5 mL (has no administration in time range)   thiamine tablet 100 mg (100 mg Oral Given 10/23/20 1150)  multivitamin with minerals tablet 1 tablet (1 tablet Oral Given [id number])  folic acid (FOLVITE) tablet 1 mg (1 mg Oral Given 10/23/20 1150)  nirmatrelvir/ritonavir EUA (renal dosing) (PAXLOVID) TABS 2 tablet (has no administration in time range)  lactated ringers bolus 1,000 mL (0 mLs Intravenous Stopped 10/23/20 [id number])    Mobility walks Moderate fall risk   Focused Assessments    R Recommendations: See Admitting Provider Note  Report given to:   Additional Notes:

## 2020-10-24 DIAGNOSIS — U071 COVID-19: Principal | ICD-10-CM

## 2020-10-24 LAB — BASIC METABOLIC PANEL
Anion gap: 11 (ref 5–15)
BUN: 11 mg/dL (ref 8–23)
CO2: 27 mmol/L (ref 22–32)
Calcium: 8.8 mg/dL — ABNORMAL LOW (ref 8.9–10.3)
Chloride: 101 mmol/L (ref 98–111)
Creatinine, Ser: 1.09 mg/dL (ref 0.61–1.24)
GFR, Estimated: >60 mL/min (ref >60)
Glucose, Bld: 87 mg/dL (ref 70–99)
Potassium: 3.4 mmol/L — ABNORMAL LOW (ref 3.5–5.1)
Sodium: 139 mmol/L (ref 135–145)

## 2020-10-24 LAB — D-DIMER, QUANTITATIVE: D-Dimer, Quant: 0.38 ug/mL-FEU (ref 0.00–0.50)

## 2020-10-24 LAB — CBC
HCT: 35.5 % — ABNORMAL LOW (ref 39.0–52.0)
Hemoglobin: 12.9 g/dL — ABNORMAL LOW (ref 13.0–17.0)
MCH: 33.7 pg (ref 26.0–34.0)
MCHC: 36.3 g/dL — ABNORMAL HIGH (ref 30.0–36.0)
MCV: 92.7 fL (ref 80.0–100.0)
Platelets: 137 10*3/uL — ABNORMAL LOW (ref 150–400)
RBC: 3.83 MIL/uL — ABNORMAL LOW (ref 4.22–5.81)
RDW: 12.5 % (ref 11.5–15.5)
WBC: 5.4 10*3/uL (ref 4.0–10.5)
nRBC: 0 % (ref 0.0–0.2)

## 2020-10-24 LAB — URINE CULTURE: Culture: NO GROWTH

## 2020-10-24 LAB — MAGNESIUM: Magnesium: 2 mg/dL (ref 1.7–2.4)

## 2020-10-24 LAB — PHOSPHORUS: Phosphorus: 2.5 mg/dL (ref 2.5–4.6)

## 2020-10-24 LAB — C-REACTIVE PROTEIN: CRP: 11.3 mg/dL — ABNORMAL HIGH (ref <1.0)

## 2020-10-24 LAB — FERRITIN: Ferritin: 737 ng/mL — ABNORMAL HIGH (ref 24–336)

## 2020-10-24 LAB — TSH: TSH: 3.878 u[IU]/mL (ref 0.350–4.500)

## 2020-10-24 MED ORDER — DICLOFENAC SODIUM 1 % EX GEL
2.0000 g | Freq: Three times a day (TID) | CUTANEOUS | Status: DC
  Administered 2020-10-24 – 2020-11-01 (×25): 2 g via TOPICAL
  Filled 2020-10-24: qty 100

## 2020-10-24 MED ORDER — POLYETHYLENE GLYCOL 3350 17 G PO PACK
17.0000 g | PACK | Freq: Every day | ORAL | Status: DC | PRN
  Administered 2020-10-24 – 2020-10-30 (×5): 17 g via ORAL
  Filled 2020-10-24 (×6): qty 1

## 2020-10-24 NOTE — Evaluation (Signed)
PPhysical Therapy Evaluation Patient Details Name: Marvin Ingram MRN: [id number] DOB: 01-01-1946 Today's Date: 10/24/2020   History of Present Illness  Marvin Ingram is a 75 y.o. male with a pertinent PMH of Parkinson's dementia and CAD s/p NSTEMI in 2007 with DES to LAD, hyperlipidemia, basal cell carcinoma, arthritis s/p R knee replacement, and bradycardia who presents to [facility] with one week of productive cough, sore throat, congestion and generalized weakness.  Clinical Impression  Patient presents with decreased mobility due to weakness, h/o Parkinson's, decreased balance, decreased activity tolerance and L hip pain.  Patient reports wife at home with Covid as well and daughter in law attending.  If family can provide 24 hr hands on assist at home okay for HHPT, but feel likely will need STSNF prior to home.  PT to continue to follow acutely.    Follow Up Recommendations SNF    Equipment Recommendations  Wheelchair (measurements PT);Wheelchair cushion (measurements PT)    Recommendations for Other Services       Precautions / Restrictions Precautions Precautions: Fall Precaution Comments: airborne      Mobility  Bed Mobility Overal bed mobility: Needs Assistance Bed Mobility: Rolling;Sidelying to Sit;Sit to Supine Rolling: Mod assist Sidelying to sit: Max assist   Sit to supine: +2 for physical assistance;Max assist   General bed mobility comments: increased time to move, assist using rail to roll and to bring legs off bed and side to sit with c/o L hip pain coming upright; to supine assist for both legs and MD in the room assisting shoulders    Transfers Overall transfer level: Needs assistance Equipment used: Rolling walker (2 wheeled) Transfers: Sit to/from [facility] Sit to Stand: Mod assist Stand pivot transfers: Mod assist       General transfer comment: increased time both to stand and to sit with mod cues for technique, hand placement,  etc.  Ambulation/Gait     Assistive device: Rolling walker (2 wheeled) Gait Pattern/deviations: Step-to pattern;Decreased stride length;Shuffle;Trunk flexed;Narrow base of support     General Gait Details: to chair, then to [facility], then backing up to bed with mod A and cues for posture, step width, hand placement, safety  Stairs            Wheelchair Mobility    Modified Rankin (Stroke Patients Only)       Balance Overall balance assessment: Needs assistance Sitting-balance support: Feet supported Sitting balance-Leahy Scale: Fair     Standing balance support: Bilateral upper extremity supported Standing balance-Leahy Scale: Poor Standing balance comment: UE support and assist for balance in standing                             Pertinent Vitals/Pain Pain Assessment: Faces Faces Pain Scale: Hurts even more Pain Location: L hip with moving in bed and sitting in chair Pain Descriptors / Indicators: Discomfort;Sharp Pain Intervention(s): Monitored during session;Limited activity within patient's tolerance;Repositioned    Home Living Family/patient expects to be discharged to:: Private residence Living Arrangements: Spouse/significant other Available Help at Discharge: Family Type of Home: House Home Access: Stairs to enter   Technical brewer of Steps: 5 Home Layout: One level Home Equipment: Environmental consultant - 2 wheels;Bedside commode;Tub bench      Prior Function Level of Independence: Needs assistance   Gait / Transfers Assistance Needed: states using cane or walker at home, has fallen, but it's been awhile  ADL's / Homemaking Assistance Needed: reports  wife helping at times with dressing/bathing        Hand Dominance   Dominant Hand: Right    Extremity/Trunk Assessment   Upper Extremity Assessment Upper Extremity Assessment: RUE deficits/detail;LUE deficits/detail RUE Deficits / Details: AAROM shoulder flexion about 110, strength shoulder  flexion 4-/5, elbow flexion 4/5, extension 3+/5, decreased grip strength RUE Coordination: decreased fine motor (tremors) LUE Deficits / Details: AAROM shoulder flexion about 100 limited by hip pain, strength shoulder flexion 3/5 limited by hip pain, elbow flexion 4/5, extension 3/5, decreased grip strength LUE Coordination: decreased fine motor (tremors)    Lower Extremity Assessment Lower Extremity Assessment: RLE deficits/detail;LLE deficits/detail RLE Deficits / Details: AROM WFL except limited ankle DF, strength hip flexion 3+/5, knee extension 4/5, ankle DF 3-/5 RLE Coordination: decreased gross motor (rigidity) LLE Deficits / Details: AROM WFL except limited ankle DF, strength hip flexion 2/5, knee extension 3+/5, ankle DF 3-/5 limited by hip pain LLE Coordination: decreased gross motor (rigidity)    Cervical / Trunk Assessment Cervical / Trunk Assessment: Kyphotic  Communication   Communication: Expressive difficulties  Cognition Arousal/Alertness: Awake/alert Behavior During Therapy: Flat affect Overall Cognitive Status: No family/caregiver present to determine baseline cognitive functioning                                 General Comments: decreased initiation, slow responses, wanted to call his wife and spoke with her somewhat appropriately, oriented to place, situation      General Comments General comments (skin integrity, edema, etc.): toileted on [facility] per pt request, assist for hygiene following bowel movement and condom catheter off with NT aware.  Took time to call patient's wife per his request.    Exercises     Assessment/Plan    PT Assessment Patient needs continued PT services  PT Problem List Decreased strength;Decreased mobility;Decreased balance;Pain;Decreased coordination;Decreased activity tolerance       PT Treatment Interventions DME instruction;Therapeutic activities;Gait training;Therapeutic exercise;Patient/family education;Balance  training;Functional mobility training;Neuromuscular re-education    PT Goals (Current goals can be found in the Care Plan section)  Acute Rehab PT Goals Patient Stated Goal: to go home PT Goal Formulation: With patient Time For Goal Achievement: 11/07/20 Potential to Achieve Goals: Good    Frequency Min 2X/week   Barriers to discharge        Co-evaluation               AM-PAC PT "6 Clicks" Mobility  Outcome Measure Help needed turning from your back to your side while in a flat bed without using bedrails?: A Lot Help needed moving from lying on your back to sitting on the side of a flat bed without using bedrails?: Total Help needed moving to and from a bed to a chair (including a wheelchair)?: A Lot Help needed standing up from a chair using your arms (e.g., wheelchair or bedside chair)?: A Lot Help needed to walk in hospital room?: A Lot Help needed climbing 3-5 steps with a railing? : Total 6 Click Score: 10    End of Session Equipment Utilized During Treatment: Gait belt Activity Tolerance: Patient limited by fatigue;Patient limited by pain Patient left: in bed;with call bell/phone within reach;with bed alarm set Nurse Communication: Other (comment) (condom cath) PT Visit Diagnosis: Other abnormalities of gait and mobility (R26.89);Muscle weakness (generalized) (M62.81)    Time: [id number] PT Time Calculation (min) (ACUTE ONLY): 66 min   Charges:   PT Evaluation $  PT Eval Moderate Complexity: 1 Mod PT Treatments $Gait Training: 8-22 mins $Therapeutic Activity: 23-37 mins        Magda Kiel, PT Acute Rehabilitation Services [id number] Office:[phone] 10/24/2020   Reginia Naas 10/24/2020, 11:44 AM

## 2020-10-24 NOTE — Progress Notes (Signed)
   Subjective: No acute overnight events. Patient was seen at bedside during rounds this morning. Pt reports feeling well this AM. Pt complains of mild left hip pain that gets better with analgesics. Pt denies SOB, cough, fever, CP, and confusion. No other complains or concerns at this time.    Objective:  Vital signs in last 24 hours: Vitals:   10/23/20 2357 10/24/20 0400 10/24/20 0721 10/24/20 1156  BP: 113/77 117/66 124/73 114/67  Pulse: (!) 53 (!) 54 63 62  Resp: '19 18 17 19  '$ Temp: 98.5 F (36.9 C) 98.3 F (36.8 C) 99.9 F (37.7 C) 98.1 F (36.7 C)  TempSrc: Axillary Axillary Axillary Oral  SpO2: 92% 97% 95% 95%  Weight:  65 kg    Height:       Constitutional: pleasant elderly male, no acute distress, more interactive than yesterday Cardiovascular: Normal rate, regular rhythm, S1 and S2 present, no murmurs, rubs, gallops. Distal pulses intact.  Respiratory: No respiratory distress, no accessory muscle use. Lungs are clear to auscultation bilaterally. Musculoskeletal: Normal bulk; cogwheel rigidity noted in bilateral upper extremities. No peripheral edema noted. Mild tenderness to palpation over posterior aspect of left hip and greater trochanter.  Skin: Warm and dry. Seborrheic dermatitis on scalp. No rash or erythema noted.    Assessment/Plan:  Active Problems:   COVID-19 virus infection  Marvin Ingram is a 75 y.o. with pertinent PMH of Parkinson's dementia, CAD s/p NSTEMI with DES in 2007,hyperlipidemia, basal cell carcinoma who presented with sore throat, cough, generalized weakness and fever and admitted for COVID-19 infection.  COVID 19 Infection ~1 week of generalized weakness, sore throat, cough, and worsening gait ataxia, in a COVID-positive. No current sxs. No leukocytosis, thrombocytosis, or lactic acidosis. Negative CXR, U/A, blood cultures and respiratory panel. Trending inflammatory markers, with small increase in CRP 11 -> 13, and ferritin downtrending 737 ->  666. Today, pt remains afebrile, is bradycardic 40-50s but at his baseline, O2 sat remains >92, and he denies sxs. Some agitation overnight, but was A&O x 3, placed on delirium precautions. No concerning physical examination findings.  - Continue Paxlovid, day 3 of 5  - Tussionex 49m q12h prn and Robitussin DM 168mq4h prn - Encourage ICS and flutter valve - Oxygen support prn for SpO2 >92% - Trending inflammatory markers  - SNF placement pending, likely after 10 day quarantine period.    Parkinson's dementia Physical deconditioning L Hip pain  Pt with Parkinson's dementia with worsening gait ataxia, presenting with overall generalized weakness. Endorses focal left hip pain, no hx of trauma or recent falls. On exam, has decreased strength in LLE compared to RLE, but overall benign exam findings. Hip XR negative for hip fracture or dislocation. Likely MSK pain in pt with advanced Parkinson's, will continue to monitor. - Voltaren and tylenol PRN for inflammatory pain, has required it about once a day.  - PT/OT eval, with recommended SNF placement    Hx of CAD s/p NSTEMI w/DES  Hyperlipidemia - Continue aspirin '81mg'$  daily - Held lipitor, can resume once paxlovid therapy is completed  Best Practice: Diet: Heart Healthy IVF: None,None VTE: Enoxaparin 40 Code: DNR  Signature: MoLajean ManesMD  Internal Medicine Resident, PGY-1 [facility]  Pager: #[phone] PM, 10/24/2020  After 5pm on weekdays and 1pm on weekends: On Call pager [phone]

## 2020-10-24 NOTE — Hospital Course (Addendum)
Marvin Ingram is a 75 y.o. with pertinent PMH of Parkinson's dementia, CAD s/p NSTEMI with DES in 2007, HLD, basal cell carcinoma who presented with sore throat, cough, generalized weakness and fever and admitted for COVID-19 infection.   COVID 19 Infection Remained asymptomatic and afebrile throughout hospitalization. O2 sat 99-100 on room air. No concerning physical examination findings. Managed conservatively with tussionex and robitussin. Encouraged ICS and flutter valve. Completed 5 day course of Paxlovid. Continuing to make progress with PT/OT.    Parkinson's dementia Physical deconditioning L Hip pain  Pt with Parkinson's dementia with gait ataxia, presenting with overall generalized weakness and focal left hip pain controlled well with tylenol; no hx of trauma or recent falls. Strength remains RLE greater than LLE, but overall benign exam findings. Hip XR negative for hip fracture or dislocation. MRI shows no acute bony abnormality, mild left hip osteoarthritis with degenerative anterior superior labral tearing, and intramuscular edema within the left adductor muscles and left iliopsoas muscle, likely a muscle strain, but viral myositis is a possibility given COVID infection. There is mild left-sided subgluteus minimus bursitis. Will continue treating conservatively with analgesics and PT.  - Voltaren and tylenol q6h for inflammatory pain - PT/OT eval, recommending SNF placement. Continues to make progress with PT/OT.    Hx of CAD s/p NSTEMI w/DES  HLD - Continued aspirin '81mg'$  daily - Lipitor once paxlovid therapy is completed

## 2020-10-24 NOTE — Progress Notes (Signed)
RE: Marvin Ingram Date of Birth: [birth year] Date: 10/24/20  Please be advised that the above-named patient has a primary diagnosis of dementia which supersedes any psychiatric diagnosis. Patient will require a short-term nursing home stay - anticipated 30 days or less for rehabilitation and strengthening.  The plan is for return home.

## 2020-10-24 NOTE — Evaluation (Signed)
Clinical/Bedside Swallow Evaluation Patient Details  Name: Marvin Ingram MRN: [id number] Date of Birth: 1945-11-14  Today's Date: 10/24/2020 Time: SLP Start Time (ACUTE ONLY): [id number] SLP Stop Time (ACUTE ONLY): 0903 SLP Time Calculation (min) (ACUTE ONLY): 16 min  Past Medical History:  Past Medical History:  Diagnosis Date   Arthritis    Basal cell carcinoma    Bradycardia    relative   CAD (coronary artery disease)    Heart disease    Hyperlipidemia    Myocardial infarction ([place]) 2007   has 2 stents   PONV (postoperative nausea and vomiting)    problems yrears ago in 1963   Seborrheic keratoses    Past Surgical History:  Past Surgical History:  Procedure Laterality Date   back (disc),  1993   CATARACT EXTRACTION Right 2016   [place]  1963/1990   NM MYOVIEW LTD     TOTAL KNEE ARTHROPLASTY Right 06/29/2016   Procedure: TOTAL KNEE ARTHROPLASTY;  Surgeon: [name], MD;  Location: [facility];  Service: Orthopedics;  Laterality: Right;   HPI:  Pt is a 75 y.o. male who presents to [facility] with productive cough, sore throat, congestion and generalized weakness. He reports feeling well up until one week prior when he started feeling weaker with worsening gait and left sided hip pain. He denies any falls. He reports decreased oral intake during this time as well. CXR (10/23/20) negative for any focal pneumonia or other acute finding. Pt found to be COVID positive (10/23/20) and admitted for further management.  PMH: Parkinson's dementia and CAD s/p NSTEMI in 2007 with DES to LAD, hyperlipidemia, basal cell carcinoma, arthritis s/p R knee replacement, and bradycardia.   Assessment / Plan / Recommendation Clinical Impression  Pt alert and slightly reclined in bed with am meal at bedside untouched. Pt denies any prior hx of swallowing difficulties and reports consuming a regular diet at baseline. Oral mechanism examination  unremarkable and volitional cough noted to be strong. Thin liquids via consecutive straw sips resulted in overt coughing in 1/2 trials. Single straw sips were without s/sx of aspiration. All solid POs were consumed without difficulty and complete oral clearance was noted. Pt reported poor appetite this am, only consuming a few bites of meal. Recommend pt continue on regular/thin liquid diet with staff to provide setup and assist for meals. SLP to f/u for tolerance.  SLP Visit Diagnosis: Dysphagia, unspecified (R13.10)    Aspiration Risk  Mild aspiration risk    Diet Recommendation Regular;Thin liquid   Liquid Administration via: Straw;Cup Medication Administration: Whole meds with liquid Supervision: Staff to assist with self feeding Compensations: Minimize environmental distractions;Slow rate;Small sips/bites Postural Changes: Seated upright at 90 degrees    Other  Recommendations Oral Care Recommendations: Oral care BID;Staff/trained caregiver to provide oral care   Follow up Recommendations Other (comment) (TBD)      Frequency and Duration min 2x/week  2 weeks       Prognosis Prognosis for Safe Diet Advancement: Good Barriers to Reach Goals: Cognitive deficits      Swallow Study   General Date of Onset: 10/23/20 HPI: Pt is a 75 y.o. male who presents to [facility] with productive cough, sore throat, congestion and generalized weakness. He reports feeling well up until one week prior when he started feeling weaker with worsening gait and left sided hip pain. He denies any falls. He reports decreased oral intake during this time  as well. CXR (10/23/20) negative for any focal pneumonia or other acute finding. Pt found to be COVID positive (10/23/20) and admitted for further management.  PMH: Parkinson's dementia and CAD s/p NSTEMI in 2007 with DES to LAD, hyperlipidemia, basal cell carcinoma, arthritis s/p R knee replacement, and bradycardia. Type of Study: Bedside Swallow Evaluation Previous  Swallow Assessment: none noted in EMR Diet Prior to this Study: Regular;Thin liquids Temperature Spikes Noted: Yes Respiratory Status: Room air History of Recent Intubation: No Behavior/Cognition: Alert;Cooperative;Pleasant mood;Confused Oral Cavity Assessment: Within Functional Limits Oral Care Completed by SLP: No Oral Cavity - Dentition: Adequate natural dentition Vision: Functional for self-feeding Self-Feeding Abilities: Able to feed self;Needs assist;Needs set up Patient Positioning: Upright in bed;Postural control adequate for testing Baseline Vocal Quality: Normal Volitional Cough: Strong Volitional Swallow: Able to elicit    Oral/Motor/Sensory Function Overall Oral Motor/Sensory Function: Within functional limits   Ice Chips Ice chips: Not tested   Thin Liquid Thin Liquid: Impaired Presentation: Straw Pharyngeal  Phase Impairments: Cough - Immediate    Nectar Thick Nectar Thick Liquid: Not tested   Honey Thick Honey Thick Liquid: Not tested   Puree Puree: Within functional limits Presentation: Spoon   Solid     Solid: Within functional limits Presentation: [place], [place], [place] Office Number: [place] 10/24/2020,10:11 AM

## 2020-10-24 NOTE — Evaluation (Signed)
Occupational Therapy Evaluation Patient Details Name: Marvin Ingram MRN: [id number] DOB: 08/10/1945 Today's Date: 10/24/2020    History of Present Illness Marvin Ingram is a 75 y.o. male with a pertinent PMH of Parkinson's dementia and CAD s/p NSTEMI in 2007 with DES to LAD, hyperlipidemia, basal cell carcinoma, arthritis s/p R knee replacement, and bradycardia who presents to [facility] with one week of productive cough, sore throat, congestion and generalized weakness.   Clinical Impression   Pt ambulated with a cane or walker at home and was assisted for bathing, dressing, setting up plate to eat and all IADL. Pt does not receive medication for his Parkinsons disease per daughter in law. Pt presents with difficulty initiating movement, generalized weakness, shoulder limitations with pain in L shoulder, hip pain and impaired standing balance. Pt currently require min to total assist for ADL and +2 max to mod assist for mobility. Recommending SNF for further rehab.     Follow Up Recommendations  SNF;Supervision/Assistance - 24 hour    Equipment Recommendations  Other (comment) (defer to next venue)    Recommendations for Other Services       Precautions / Restrictions Precautions Precautions: Fall Precaution Comments: airborne      Mobility Bed Mobility Overal bed mobility: Needs Assistance Bed Mobility: Supine to Sit;Sit to Supine     Supine to sit: +2 for physical assistance;Max assist Sit to supine: +2 for physical assistance;Max assist   General bed mobility comments: minimal initiation    Transfers Overall transfer level: Needs assistance Equipment used: Rolling walker (2 wheeled) Transfers: Sit to/from [facility] Sit to Stand: Mod assist         General transfer comment: increased time both to stand and to sit with mod cues for technique, hand placement, etc.    Balance Overall balance assessment: Needs assistance Sitting-balance support: Feet  supported Sitting balance-Leahy Scale: Fair     Standing balance support: Bilateral upper extremity supported Standing balance-Leahy Scale: Poor                             ADL either performed or assessed with clinical judgement   ADL Overall ADL's : Needs assistance/impaired Eating/Feeding: Minimal assistance;Bed level   Grooming: Moderate assistance;Sitting   Upper Body Bathing: Maximal assistance;Sitting   Lower Body Bathing: Total assistance;Sit to/from stand   Upper Body Dressing : Moderate assistance;Sitting   Lower Body Dressing: Total assistance;Sit to/from stand       Toileting- Water quality scientist and Hygiene: Total assistance;Sit to/from stand               Vision Patient Visual Report: No change from baseline       Perception     Praxis      Pertinent Vitals/Pain Pain Assessment: Faces Faces Pain Scale: Hurts even more Pain Location: L hip, L shoulder Pain Descriptors / Indicators: Discomfort;Sore Pain Intervention(s): Monitored during session;Repositioned;RN gave pain meds during session     Hand Dominance Right   Extremity/Trunk Assessment Upper Extremity Assessment Upper Extremity Assessment: RUE deficits/detail;LUE deficits/detail RUE Deficits / Details: AAROM shoulder flexion about 110, strength shoulder flexion 4-/5, elbow flexion 4/5, extension 3+/5, decreased grip strength RUE Coordination: decreased fine motor (tremor) LUE Deficits / Details: AAROM shoulder flexion about 100 limited by hip pain, strength shoulder flexion 3/5 limited by hip pain, elbow flexion 4/5, extension 3/5, decreased grip strength LUE Coordination: decreased fine motor (tremor)   Lower Extremity Assessment Lower  Extremity Assessment: Defer to PT evaluation   Cervical / Trunk Assessment Cervical / Trunk Assessment: Kyphotic   Communication Communication Communication: Expressive difficulties   Cognition Arousal/Alertness: Awake/alert Behavior  During Therapy: Flat affect Overall Cognitive Status: History of cognitive impairments - at baseline                                 General Comments: daughter in law assisting with history   General Comments       Exercises     Shoulder Instructions      Home Living Family/patient expects to be discharged to:: Private residence Living Arrangements: Spouse/significant other Available Help at Discharge: Family;Available 24 hours/day (wife has covid) Type of Home: House Home Access: Stairs to enter [employer] of Steps: 5   Home Layout: One level     Bathroom Shower/Tub: Teacher, early years/pre: Handicapped height     Home Equipment: Environmental consultant - 2 wheels;Bedside commode;Tub bench          Prior Functioning/Environment Level of Independence: Needs assistance  Gait / Transfers Assistance Needed: states using cane or walker at home, has fallen, but it's been awhile ADL's / Homemaking Assistance Needed: wife helps with showering, dressing, cutting food and all IADL Communication / Swallowing Assistance Needed: low volume, mumbles at times          OT Problem List: Decreased activity tolerance;Impaired balance (sitting and/or standing);Decreased coordination;Decreased cognition;Decreased safety awareness;Decreased knowledge of use of DME or AE;Pain;Impaired UE functional use      OT Treatment/Interventions: DME and/or AE instruction;Patient/family education;Balance training;Therapeutic activities;Self-care/ADL training;Therapeutic exercise    OT Goals(Current goals can be found in the care plan section) Acute Rehab OT Goals Patient Stated Goal: to go home OT Goal Formulation: With patient Time For Goal Achievement: 11/07/20 Potential to Achieve Goals: Fair  OT Frequency: Min 2X/week   Barriers to D/C:            Co-evaluation              AM-PAC OT "6 Clicks" Daily Activity     Outcome Measure Help from another person  eating meals?: A Little Help from another person taking care of personal grooming?: A Lot Help from another person toileting, which includes using toliet, bedpan, or urinal?: Total Help from another person bathing (including washing, rinsing, drying)?: A Lot Help from another person to put on and taking off regular upper body clothing?: A Lot Help from another person to put on and taking off regular lower body clothing?: Total 6 Click Score: 11   End of Session Equipment Utilized During Treatment: Rolling walker;Gait belt  Activity Tolerance: Patient tolerated treatment well Patient left: in bed;with bed alarm set;with family/visitor present;with call bell/phone within reach  OT Visit Diagnosis: Unsteadiness on feet (R26.81);Other abnormalities of gait and mobility (R26.89);Pain;Muscle weakness (generalized) (M62.81);Other symptoms and signs involving cognitive function                Time: 1440-1512 OT Time Calculation (min): 32 min Charges:  OT General Charges $OT Visit: 1 Visit OT Evaluation $OT Eval Moderate Complexity: 1 Mod OT Treatments $Therapeutic Activity: 8-22 mins  Nestor Lewandowsky, OTR/L Acute Rehabilitation Services Pager: [phone] Office: [phone]  Malka So 10/24/2020, 4:15 PM

## 2020-10-24 NOTE — TOC Initial Note (Signed)
Transition of Care [facility]) - Initial/Assessment Note    Patient Details  Name: Marvin Ingram MRN: [id number] Date of Birth: [birth year]  Transition of Care [facility]) CM/SW Contact:    Benard Halsted, LCSW Phone Number: 10/24/2020, 4:17 PM  Clinical Narrative:                 CSW received consult for possible SNF placement at time of discharge. CSW spoke with patient's spouse. She reported that she is currently unable to care for patient at their home given patient's current physical needs and fall risk. Patient's spouse expressed understanding of PT recommendation and is agreeable to SNF placement at time of discharge. CSW discussed insurance authorization process. CSW explained the current barrier of not having any SNFs that will accept COVID this week. She stated understanding that patient will remain in the hospital until a bed comes open. She reported normally her son helps her care for patient but he also has COVID and is in [place]. CSW will continue to follow for if a bed opens at [facility] versus patient being out of his 10 day isolation period.    Skilled Nursing Rehab Facilities-   [email or web address] *Ratings updated quarterly   Ratings out of 5 possible   Name Address  Phone # [place] Inspection Overall  [facility] [address] [place] '5  4 4  '$ [facility]  [address] [place], [place] [phone] '4 2 4 4  '$ [facility] [place], [place] '2 3 1 1  '$ [place], [place] '3 3 4 4  '$ [facility] [address], [place] '3  2 2  '$ [facility] [place] '2 2 4 4  '$ [facility] [address] [place] '5 2 2 3  '$ [facility] [address] [place] '4 2 2 2  '$ [facility] [place], [place] [phone] '5 2 2 3   '$ [facility] [phone], [place] [phone] '5 1 2 2  '$ [facility] [address] [facility] [phone] '5 2 2 3  '$ [place] [id number] '3 1 1 1     '$ Expected Discharge Plan: [place] Barriers to Discharge: Continued Medical Work up, SNF Covid, SNF Pending bed offer, Insurance Authorization   Patient Goals and CMS Choice Patient states their goals for this hospitalization and ongoing recovery are:: Rehab CMS Medicare.gov Compare Post Acute Care list provided to:: Patient Represenative (must comment) Choice offered to / list presented to : Spouse  Expected Discharge Plan and Services Expected Discharge Plan: [place] In-house Referral: Clinical Social Work   Post Acute Care Choice: [place] Living arrangements for the past 2 months: [place]                                      Prior Living Arrangements/Services Living arrangements for the past 2 months: Single Family Home Lives with:: Spouse Patient language and need for interpreter reviewed:: Yes Do you feel safe going back to the place where you live?: Yes      Need for Family Participation in Patient Care: Yes (Comment) Care giver support system in place?: Yes (comment)   Criminal Activity/Legal Involvement Pertinent to Current Situation/Hospitalization: No - Comment as needed  Activities of Daily Living Home Assistive Devices/Equipment: Cane (specify quad or straight) ADL Screening (condition at time of admission) Patient's cognitive ability adequate to safely complete daily activities?: Yes Is the patient deaf or have difficulty hearing?: No Does the patient have difficulty seeing, even when wearing glasses/contacts?: Yes Does the patient have difficulty concentrating, remembering, or making decisions?: No Patient able to express need for assistance with ADLs?: Yes Does the patient  have difficulty dressing or bathing?: Yes Independently performs ADLs?: No Communication: Independent Dressing (OT): Needs assistance Is this a change from baseline?: Pre-admission baseline Grooming: Needs assistance Is this a change from baseline?: Pre-admission baseline Feeding: Independent Bathing: Needs assistance Is this a change from baseline?: Pre-admission baseline Toileting: Needs assistance Is this a change from baseline?: Pre-admission baseline In/Out Bed: Needs assistance Is this a change from baseline?: Pre-admission baseline Walks in Home: Needs assistance Is this a change from baseline?: Pre-admission baseline Does the patient have difficulty walking or climbing stairs?: Yes Weakness of Legs: Both Weakness of Arms/Hands: Both  Permission Sought/Granted Permission sought to share information with : Facility Sport and exercise psychologist, Family Supports Permission granted to share information with : Yes, Verbal Permission Granted  Share Information with NAME: Olin Hauser  Permission granted to share info w AGENCY: SNFs  Permission granted to share info w Relationship: Spouse  Permission granted to share info w Contact Information: [phone]  Emotional Assessment   Attitude/Demeanor/Rapport: Engaged Affect (typically observed): Appropriate Orientation: : Oriented to Self, Oriented to Place, Oriented to  Time, Oriented to Situation Alcohol / Substance Use: Not Applicable Psych Involvement: No (comment)  Admission diagnosis:  Hip pain [M25.559] Viral upper respiratory tract infection [J06.9] COVID-19 virus infection [U07.1] Patient Active Problem List   Diagnosis Date Noted   COVID-19 virus infection 10/23/2020   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

## 2020-10-24 NOTE — Progress Notes (Addendum)
Subjective: No acute overnight events. Patient was seen at bedside during rounds this morning. Pt reports feeling "pretty good" this AM. Pt complains of left hip pain and right shoulder pain x 1 week. Pain is worse when moving around and while sitting in the chair. Pt denies vision changes, word finding difficulty, CP, cough and SOB. No other complains or concerns at this time.    Objective:  Vital signs in last 24 hours: Vitals:   10/23/20 1927 10/23/20 2357 10/24/20 0400 10/24/20 0721  BP: 96/72 113/77 117/66 124/73  Pulse: (!) 59 (!) 53 (!) 54 63  Resp: '18 19 18 17  '$ Temp: 99.4 F (37.4 C) 98.5 F (36.9 C) 98.3 F (36.8 C) 99.9 F (37.7 C)  TempSrc: Oral Axillary Axillary Axillary  SpO2: 96% 92% 97% 95%  Weight:   65 kg   Height:       Constitutional: Chronically ill appearing elderly male, no acute distress  HENT: Normocephalic and atraumatic, EOMI, moist mucous membranes Cardiovascular: Normal rate, regular rhythm, S1 and S2 present, no murmurs, rubs, gallops.  Distal pulses intact.  Respiratory: No respiratory distress, no accessory muscle use. Lungs are clear to auscultation bilaterally. Musculoskeletal: Normal bulk; cogwheel rigidity noted in bilateral upper extremities. No peripheral edema noted. Tenderness to palpation over posterior aspect of left hip and greater trochanter.  Neurological: Is alert and oriented x4, strength 5/5 in BLE, 5/5 in RLE, 3/5 in LLE (limited due to hip pain) Skin: Warm and dry. Seborrheic dermatitis on scalp. No rash or erythema noted.    Assessment/Plan:  Active Problems:   COVID-19 virus infection  Marvin Ingram is a 75 y.o. with pertinent PMH of Parkinson's dementia, CAD s/p NSTEMI with DES in 2007,hyperlipidemia, basal cell carcinoma who presented with sore throat, cough, generalized weakness and fever and admitted for COVID-19 infection.   COVID 19 Infection Patient with ~1 week of generalized weakness, sore throat, cough, and  worsening gait ataxia. T-max of 102.1 with EMS, but afebrile in ED. Patient is noted to be COVID-positive. No leukocytosis, thrombocytosis, or lactic acidosis. Negative CXR, U/A, blood cultures and respiratory panel. Slightly elevated inflammatory markers, CRP 11 and ferritin 737. Today, pt remains afebrile, is bradycardic 40-50s but at his baseline, O2 sat remains >92, and he denies fevers and cough, but endorses a mild sore throat. No concerning physical examination findings.  - Continue Paxlovid, day 2 of 5  - Tussionex 64m q12h prn and Robitussin DM 126mq4h prn - Encourage ICS and flutter valve - Oxygen support prn for SpO2 >92% - Trending inflammatory markers    Parkinson's dementia Physical deconditioning L Hip pain  Pt with Parkinson's dementia with worsening gait ataxia, presenting with overall generalized weakness. Endorses focal left hip pain, no hx of trauma or recent falls. On exam, has decreased strength in LLE compared to RLE, but overall benign exam findings. Hip XR negative for hip fracture or dislocation. Likely MSK pain in pt with advanced Parkinson's, will continue to monitor. - Voltaren and tylenol PRN for inflammatory pain  - PT/OT eval, with recommended SNF placement    Hx of CAD s/p NSTEMI w/DES  Hyperlipidemia - Continue aspirin '81mg'$  daily - Held lipitor, can resume once paxlovid therapy is completed  Best Practice: Diet: Heart Healthy IVF: None,None VTE: Enoxaparin 40 Code: DNR  Signature: MoLajean ManesMD  Internal Medicine Resident, PGY-1 [facility]  Pager: #[phone]:29 AM, 10/24/2020  After 5pm on weekdays and 1pm on weekends: On  Call pager [phone]

## 2020-10-24 NOTE — NC FL2 (Signed)
[place] LEVEL OF CARE SCREENING TOOL     IDENTIFICATION  Patient Name: Marvin Ingram Birthdate: February 09, 1946 Sex: male Admission Date (Current Location): 10/23/2020  [facility] and [place] Number:  Herbalist and Address:  [facility], [place] [address], [place], [place] [address]      Provider Number: [id number]  Attending Physician Name and Address:  Sid Falcon, MD  Relative Name and Phone Number:  Adony Burningham [phone]    Current Level of Care: Hospital Recommended Level of Care: [place] Prior Approval Number:    Date Approved/Denied:   PASRR Number: PENDING  Discharge Plan: SNF    Current Diagnoses: Patient Active Problem List   Diagnosis Date Noted   COVID-19 virus infection 10/23/2020   S/P total knee replacement 06/29/2016   Palpitations 01/23/2016   CAD (coronary artery disease) of artery bypass graft 01/19/2014   Hyperlipidemia 01/19/2014    Orientation RESPIRATION BLADDER Height & Weight     Self, Time, Situation, Place  Normal Continent, External catheter Weight: 143 lb 4.8 oz (65 kg) Height:  5' 9.5" (176.5 cm)  BEHAVIORAL SYMPTOMS/MOOD NEUROLOGICAL BOWEL NUTRITION STATUS      Continent Diet (See DC summary)  AMBULATORY STATUS COMMUNICATION OF NEEDS Skin   Extensive Assist Verbally Normal                       Personal Care Assistance Level of Assistance  Bathing, Feeding, Dressing Bathing Assistance: Maximum assistance Feeding assistance: Independent Dressing Assistance: Maximum assistance     Functional Limitations Info  Sight, Hearing, Speech Sight Info: Impaired Hearing Info: Adequate Speech Info: Adequate    SPECIAL CARE FACTORS FREQUENCY  PT (By licensed PT), OT (By licensed OT)     PT Frequency: 5x week OT Frequency: 5x week            Contractures Contractures Info: Not present    Additional Factors Info  Code Status, Allergies, Isolation  Precautions Code Status Info: DNR Allergies Info: Amoxicillin   Doxycycline   Gatifloxacin   Prednisone     Isolation Precautions Info: Covid positive     Current Medications (10/24/2020):  This is the current hospital active medication list Current Facility-Administered Medications  Medication Dose Route Frequency Provider Last Rate Last Admin   acetaminophen (TYLENOL) tablet 650 mg  650 mg Oral Q6H PRN Aslam, Loralyn Freshwater, MD       Or   acetaminophen (TYLENOL) suppository 650 mg  650 mg Rectal Q6H PRN Aslam, Sadia, MD       chlorpheniramine-HYDROcodone (TUSSIONEX) 10-8 MG/5ML suspension 5 mL  5 mL Oral Q12H PRN Aslam, Sadia, MD       diclofenac Sodium (VOLTAREN) 1 % topical gel 2 g  2 g Topical TID Virl Axe, MD   2 g at 10/24/20 1453   enoxaparin (LOVENOX) injection 40 mg  40 mg Subcutaneous Q24H Aslam, Sadia, MD   40 mg at [id number]   folic acid (FOLVITE) tablet 1 mg  1 mg Oral Daily Aslam, Sadia, MD   1 mg at 10/24/20 1039   guaiFENesin-dextromethorphan (ROBITUSSIN DM) 100-10 MG/5ML syrup 10 mL  10 mL Oral Q4H PRN Aslam, Loralyn Freshwater, MD       multivitamin with minerals tablet 1 tablet  1 tablet Oral Daily Harvie Heck, MD   1 tablet at 10/24/20 1040   nirmatrelvir/ritonavir EUA (renal dosing) (PAXLOVID) TABS 2 tablet  2 tablet Oral BID Gilles Chiquito  B, MD   2 tablet at 10/24/20 1041   polyethylene glycol (MIRALAX / GLYCOLAX) packet 17 g  17 g Oral Daily PRN Lajean Manes, MD   17 g at 10/24/20 1454   sodium chloride flush (NS) 0.9 % injection 3 mL  3 mL Intravenous Q12H Aslam, Sadia, MD   3 mL at 10/24/20 1040   thiamine tablet 100 mg  100 mg Oral Daily Aslam, Loralyn Freshwater, MD   100 mg at 10/24/20 1040     Discharge Medications: Please see discharge summary for a list of discharge medications.  Relevant Imaging Results:  Relevant Lab Results:   Additional Information SSN [id number]  Loreta Ave, LCSWA

## 2020-10-24 NOTE — Progress Notes (Signed)
  Date: 10/24/2020  Patient name: Marvin Ingram  Medical record number: [id number]  Date of birth: May 08, 1945   I have seen and evaluated Marvin Ingram and discussed their care with the Residency Team. Briefly, Marvin Ingram is a 75 year old man who presented with fever, sore throat, hip pain and was found to have COVID.  He was admitted for treatment.  Also noted to have left hip pain which has been worsening about the same time as the sore throat.  He has not had a fall.    Vitals:   10/24/20 1700 10/24/20 2000  BP: (!) 149/83 113/69  Pulse: 66 60  Resp: (!) 23 20  Temp: 98.6 F (37 C) 98.5 F (36.9 C)  SpO2: 98% 95%   Gen: Awake, sitting on side of bed, breathing comfortably Eyes: Anicteric sclerae HENT: Neck is supple, he is slow to move, masked facies CV: rr, nr, no murmur Pulm: CTAB, no wheezing, no oxygen requirement Abd; Scaphoid, NT MSK: he has decreased muscle mass, TTP over sacrum on the left Neuro: Masked facies, slow to move, pill rolling tremor, significant stiffness and cogwheeling.  He has normal strength throughout but is slow to move Psych: Pleasant, no distress Skin: SK's on scalp and back, no rash noted on exposed skin  Assessment and Plan: I have seen and evaluated the patient as outlined above. I agree with the formulated Assessment and Plan as detailed in the residents' note, with the following changes:   1. COVID infection - Paxlovid will be continued - Cough medication with tussionex and robitussin - Follow inflammatory markers - Follow blood cultures  2. Hip pain - Voltaren gel - No fracture on xray.  PT/OT evaluation  Other issues per Dr. Serita Grit daily note.   Sid Falcon, MD 8/18/20229:11 PM

## 2020-10-25 DIAGNOSIS — U071 COVID-19: Secondary | ICD-10-CM | POA: Diagnosis not present

## 2020-10-25 DIAGNOSIS — M25559 Pain in unspecified hip: Secondary | ICD-10-CM | POA: Diagnosis not present

## 2020-10-25 LAB — MAGNESIUM: Magnesium: 1.9 mg/dL (ref 1.7–2.4)

## 2020-10-25 LAB — CBC
HCT: 36.7 % — ABNORMAL LOW (ref 39.0–52.0)
Hemoglobin: 13 g/dL (ref 13.0–17.0)
MCH: 33.1 pg (ref 26.0–34.0)
MCHC: 35.4 g/dL (ref 30.0–36.0)
MCV: 93.4 fL (ref 80.0–100.0)
Platelets: 139 10*3/uL — ABNORMAL LOW (ref 150–400)
RBC: 3.93 MIL/uL — ABNORMAL LOW (ref 4.22–5.81)
RDW: 12.4 % (ref 11.5–15.5)
WBC: 4.9 10*3/uL (ref 4.0–10.5)
nRBC: 0 % (ref 0.0–0.2)

## 2020-10-25 LAB — BASIC METABOLIC PANEL
Anion gap: 10 (ref 5–15)
BUN: 12 mg/dL (ref 8–23)
CO2: 28 mmol/L (ref 22–32)
Calcium: 8.5 mg/dL — ABNORMAL LOW (ref 8.9–10.3)
Chloride: 99 mmol/L (ref 98–111)
Creatinine, Ser: 1.06 mg/dL (ref 0.61–1.24)
GFR, Estimated: >60 mL/min (ref >60)
Glucose, Bld: 100 mg/dL — ABNORMAL HIGH (ref 70–99)
Potassium: 3.2 mmol/L — ABNORMAL LOW (ref 3.5–5.1)
Sodium: 137 mmol/L (ref 135–145)

## 2020-10-25 LAB — FERRITIN: Ferritin: 666 ng/mL — ABNORMAL HIGH (ref 24–336)

## 2020-10-25 LAB — PHOSPHORUS: Phosphorus: 2.7 mg/dL (ref 2.5–4.6)

## 2020-10-25 LAB — C-REACTIVE PROTEIN: CRP: 12.7 mg/dL — ABNORMAL HIGH (ref <1.0)

## 2020-10-25 MED ORDER — POTASSIUM CHLORIDE CRYS ER 20 MEQ PO TBCR
40.0000 meq | EXTENDED_RELEASE_TABLET | Freq: Two times a day (BID) | ORAL | Status: AC
  Administered 2020-10-25 (×2): 40 meq via ORAL
  Filled 2020-10-25 (×2): qty 2

## 2020-10-25 MED ORDER — HALOPERIDOL LACTATE 5 MG/ML IJ SOLN: 2.0000 mg | Freq: Four times a day (QID) | INTRAMUSCULAR | Status: DC | PRN

## 2020-10-25 NOTE — Progress Notes (Signed)
  Speech Language Pathology Treatment: Dysphagia  Patient Details Name: Marvin Ingram MRN: [id number] DOB: 06-07-1945 Today's Date: 10/25/2020 Time: [id number] SLP Time Calculation (min) (ACUTE ONLY): 19 min  Assessment / Plan / Recommendation Clinical Impression  Pt seen for skilled dysphagia tx with tolerance of Regular/thin liquids with slow mastication and prolonged oral preparation with solids.  Delayed cough noted with larger volumes of thin liquids, but eliminated with smaller sips supervised via straw.  Decreasing distractions within the environment assisted with sustaining attention/min confusion intermittently during trial.  Pt's vocal quality noted to be lower intensity with speech being 75% intelligible within simple conversation.  Strategies to increase overall breath support provided to increase awareness/projection of voice.  Pt would benefit from reducing current diet to Dysphagia 3/thin liquids to assist with generalized weakness/fatigue from mastication of regular foods while he is in generalized state of weakness/history of PD and new dx of Covid impacting his swallow.  Family/pt in agreement with change.  ST will continue to f/u for diet tolerance while in acute setting.  Pt may benefit from [facility] for f/u with diet tolerance/dysarthria.    HPI HPI: Pt is a 75 y.o. male who presents to [facility] with productive cough, sore throat, congestion and generalized weakness. He reports feeling well up until one week prior when he started feeling weaker with worsening gait and left sided hip pain. He denies any falls. He reports decreased oral intake during this time as well. CXR (10/23/20) negative for any focal pneumonia or other acute finding. Pt found to be COVID positive (10/23/20) and admitted for further management.  PMH: Parkinson's dementia and CAD s/p NSTEMI in 2007 with DES to LAD, hyperlipidemia, basal cell carcinoma, arthritis s/p R knee replacement, and bradycardia.      SLP Plan  ST  continue within acute setting for dysphagia tx       Recommendations  Diet recommendations: Dysphagia 3/Thin liquid Liquids provided via: Straw Medication Administration: Whole meds with liquid Supervision: Patient able to self feed;Intermittent supervision to cue for compensatory strategies Compensations: Minimize environmental distractions;Slow rate;Small sips/bites Postural Changes and/or Swallow Maneuvers: Seated upright 90 degrees                Oral Care Recommendations: Oral care BID Follow up Recommendations: Other (comment) (TBD) SLP Visit Diagnosis: Dysphagia, unspecified (R13.10) Plan: Discharge SLP treatment due to (comment)                       Elvina Sidle, M.S., CCC-SLP 10/25/2020, 3:47 PM

## 2020-10-25 NOTE — Progress Notes (Signed)
   Subjective: No acute overnight events. Patient was seen at bedside during rounds this morning. Pt reports feeling well this AM. Pt complains of mild left hip pain that improves with analgesics. Pt denies SOB, cough, fever, CP, and confusion. He states he may be going home on Monday with home health. No other complains or concerns at this time.    Objective:  Vital signs in last 24 hours: Vitals:   10/25/20 0000 10/25/20 0614 10/25/20 0751 10/25/20 1100  BP: (!) 155/88 (!) 142/76 (!) 161/87 109/81  Pulse: 69 60 63 (!) 56  Resp: '20 16 16 18  '$ Temp: 98.4 F (36.9 C) 98.2 F (36.8 C) 98.4 F (36.9 C) 98.1 F (36.7 C)  TempSrc: Oral Axillary Oral Oral  SpO2: 97% 100% 100% 100%  Weight:  62.6 kg    Height:       Constitutional: pleasant elderly male, no acute distress, more interactive than yesterday Cardiovascular: Normal rate, regular rhythm, S1 and S2 present, no murmurs, rubs, gallops. Distal pulses intact.  Respiratory: No respiratory distress, no accessory muscle use. Lungs are clear to auscultation bilaterally. Musculoskeletal: Normal bulk; cogwheel rigidity noted in bilateral upper extremities. No peripheral edema noted. Mild tenderness to palpation over posterior aspect of left hip and greater trochanter.  Skin: Warm and dry. Seborrheic dermatitis on scalp. No rash or erythema noted.    Assessment/Plan:  Active Problems:   COVID-19 virus infection  Marvin Ingram is a 75 y.o. with pertinent PMH of Parkinson's dementia, CAD s/p NSTEMI with DES in 2007,hyperlipidemia, basal cell carcinoma who presented with sore throat, cough, generalized weakness and fever and admitted for COVID-19 infection.  COVID 19 Infection ~1 week of generalized weakness, sore throat, cough, and worsening gait ataxia, in a COVID-positive. No current sxs. No leukocytosis, thrombocytosis, or lactic acidosis on presentation. No leukocytosis today. CRP and ferritin trending down. Today, pt remains  afebrile, is bradycardic 50s but at his baseline, O2 sat remains >92, and he denies sxs. No agitation or delirium overnight or this AM. No concerning physical examination findings.SNF placement pending, likely after 10 day quarantine period, however pt states he may go home on Monday if his wife regains strength to help care for him.  - Continue Paxlovid, day 4 of 5  - Tussionex 56m q12h prn and Robitussin DM 15mq4h prn - Encourage ICS and flutter valve - Oxygen support prn for SpO2 >92%   Parkinson's dementia Physical deconditioning L Hip pain  Pt with Parkinson's dementia with worsening gait ataxia, presenting with overall generalized weakness. Endorses focal left hip pain, no hx of trauma or recent falls. On exam, has decreased strength in LLE compared to RLE, but overall benign exam findings. Hip XR negative for hip fracture or dislocation. Likely MSK pain in pt with advanced Parkinson's, will continue to monitor. - Voltaren and tylenol PRN for inflammatory pain, has required it about 1x a day.  - PT/OT eval, with recommended SNF placement    Hx of CAD s/p NSTEMI w/DES  Hyperlipidemia - Continue aspirin '81mg'$  daily - Held lipitor, can resume once paxlovid therapy is completed  Best Practice: Diet: dysphagia 3 IVF: None,None VTE: Enoxaparin 40 Code: DNR  Signature: MoLajean ManesMD  Internal Medicine Resident, PGY-1 [facility]  Pager: #[phone]/20/2022 8:09 AM After 5pm on weekdays and 1pm on weekends: On Call pager [phone]

## 2020-10-26 LAB — BASIC METABOLIC PANEL
Anion gap: 7 (ref 5–15)
BUN: 11 mg/dL (ref 8–23)
CO2: 30 mmol/L (ref 22–32)
Calcium: 8.4 mg/dL — ABNORMAL LOW (ref 8.9–10.3)
Chloride: 99 mmol/L (ref 98–111)
Creatinine, Ser: 0.98 mg/dL (ref 0.61–1.24)
GFR, Estimated: >60 mL/min (ref >60)
Glucose, Bld: 98 mg/dL (ref 70–99)
Potassium: 3.8 mmol/L (ref 3.5–5.1)
Sodium: 136 mmol/L (ref 135–145)

## 2020-10-26 LAB — CBC
HCT: 37 % — ABNORMAL LOW (ref 39.0–52.0)
Hemoglobin: 13.2 g/dL (ref 13.0–17.0)
MCH: 33.2 pg (ref 26.0–34.0)
MCHC: 35.7 g/dL (ref 30.0–36.0)
MCV: 93 fL (ref 80.0–100.0)
Platelets: 150 10*3/uL (ref 150–400)
RBC: 3.98 MIL/uL — ABNORMAL LOW (ref 4.22–5.81)
RDW: 12.2 % (ref 11.5–15.5)
WBC: 3.2 10*3/uL — ABNORMAL LOW (ref 4.0–10.5)
nRBC: 0 % (ref 0.0–0.2)

## 2020-10-26 LAB — FERRITIN: Ferritin: 636 ng/mL — ABNORMAL HIGH (ref 24–336)

## 2020-10-26 LAB — C-REACTIVE PROTEIN: CRP: 9.1 mg/dL — ABNORMAL HIGH (ref <1.0)

## 2020-10-26 LAB — PHOSPHORUS: Phosphorus: 2.8 mg/dL (ref 2.5–4.6)

## 2020-10-26 LAB — MAGNESIUM: Magnesium: 2 mg/dL (ref 1.7–2.4)

## 2020-10-26 MED ORDER — ACETAMINOPHEN 325 MG PO TABS: 650.0000 mg | ORAL_TABLET | Freq: Four times a day (QID) | ORAL | Status: DC | PRN

## 2020-10-26 MED ORDER — ACETAMINOPHEN 325 MG PO TABS
650.0000 mg | ORAL_TABLET | Freq: Three times a day (TID) | ORAL | Status: DC
  Administered 2020-10-26 – 2020-10-27 (×3): 650 mg via ORAL
  Filled 2020-10-26 (×3): qty 2

## 2020-10-26 MED ORDER — ACETAMINOPHEN 650 MG RE SUPP: 650.0000 mg | Freq: Three times a day (TID) | RECTAL | Status: DC

## 2020-10-26 MED ORDER — ACETAMINOPHEN 650 MG RE SUPP: 650.0000 mg | Freq: Four times a day (QID) | RECTAL | Status: DC | PRN

## 2020-10-26 MED ORDER — ACETAMINOPHEN 325 MG PO TABS: 650.0000 mg | ORAL_TABLET | Freq: Three times a day (TID) | ORAL | Status: DC

## 2020-10-26 NOTE — TOC Progression Note (Signed)
Transition of Care [facility]) - Progression Note    Patient Details  Name: IOAN STANDBERRY MRN: [id number] Date of Birth: [birth year]  Transition of Care [facility]) CM/SW [place], LCSW Phone Number: 10/26/2020, 8:30 AM  Clinical Narrative:    2pm-CSW received request from patient's spouse to take patient home with home health rather than SNF. CSW sent request to [facility] to assist with finding an agency to accept patient.   4pm-Spouse requested to speak with CSW as she had not heard from anyone about home health yet. CSW spoke with her and explained how home health will not come in very often and family will need to provide the bulk of patient care. She expressed understanding and stated her sons and daughter in laws are very supportive and will help her. CSW also addressed questions about agencies in network with [facility] and the need to find an agency that can accept patient and that there is normally a 24 hour turn around time for them to come out and see the patient once they discharge. CSW also spoke with her about DME needs and she is requesting a wheelchair from [facility] if they are in network as she has spoken with them and they were very helpful. CSW also answered questions about private duty agencies and spouse is aware it would be out of pocket. She is requesting family transport him by car at discharge as she does not want the co-pay associated with non-emergency services. RNCM will touch base with her once an agency has been located.   6pm-CSW received request from patient's daughter in law to speak with her in the hallway. CSW addressed her concerns and provided supportive listening. Family is very willing to help patient and provide around the clock care and she believes private duty care is also a good idea. CSW explained hospital cannot recommend any agencies but that she should research them and call and compare pricing and availability.       Expected  Discharge Plan: [place] Barriers to Discharge: Continued Medical Work up, SNF Covid, SNF Pending bed offer, Ship broker  Expected Discharge Plan and Services Expected Discharge Plan: [place] In-house Referral: Clinical Social Work   Post Acute Care Choice: [place] Living arrangements for the past 2 months: Single Family Home                                       Social Determinants of Health (SDOH) Interventions    Readmission Risk Interventions No flowsheet data found.

## 2020-10-26 NOTE — Progress Notes (Signed)
   Subjective: No acute overnight events. Patient was seen at bedside during rounds this morning. Pt reports feeling well this AM. Pt complains of mild left hip pain that improves with analgesics. Pt denies SOB, cough, fever, CP, and confusion. He states he may be going home on Monday as his family has support to help take care of him. No other complains or concerns at this time. Daughter-in-law complains that L hip pain restricts his ability to be mobile.    Objective:  Vital signs in last 24 hours: Vitals:   10/25/20 2018 10/26/20 0000 10/26/20 0547 10/26/20 0725  BP: (!) 114/93 130/85 (!) 141/83 (!) 166/85  Pulse:    63  Resp: '18 18  17  '$ Temp: 98.1 F (36.7 C) 98.2 F (36.8 C) 97.9 F (36.6 C) 98.3 F (36.8 C)  TempSrc: Oral Oral Oral Axillary  SpO2: 100% 100% 100% 93%  Weight:   62.1 kg   Height:       Constitutional: pleasant elderly male, no acute distress, more interactive than yesterday Cardiovascular: Normal rate, regular rhythm, S1 and S2 present, no murmurs, rubs, gallops. Distal pulses intact.  Respiratory: No respiratory distress, no accessory muscle use. Lungs are clear to auscultation bilaterally. Musculoskeletal: Normal bulk; cogwheel rigidity noted in bilateral upper extremities. No peripheral edema noted. Mild tenderness to palpation over posterior aspect of left hip and greater trochanter.  Skin: Warm and dry. Seborrheic dermatitis on scalp. No rash or erythema noted.    Assessment/Plan:  Active Problems:   COVID-19 virus infection   Hip pain  Marvin Ingram is a 75 y.o. with pertinent PMH of Parkinson's dementia, CAD s/p NSTEMI with DES in 2007,hyperlipidemia, basal cell carcinoma who presented with sore throat, cough, generalized weakness and fever and admitted for COVID-19 infection.  COVID 19 Infection No current sxs. No leukocytosis, thrombocytosis, or lactic acidosis on presentation. No leukocytosis today. CRP and ferritin trending down. Today, pt  remains afebrile, is bradycardic 50s but at his baseline, O2 sat remains >92, and he denies sxs. No agitation or delirium overnight or this AM. No concerning physical examination findings. SNF placement pending, likely after 10 day quarantine period, however pt states he may go home on Monday if his wife regains strength to help care for him.  - Continue Paxlovid, day 5 of 5  - Tussionex 52m q12h prn and Robitussin DM 196mq4h prn - Encourage ICS and flutter valve - Oxygen support prn for SpO2 >92%   Parkinson's dementia Physical deconditioning L Hip pain  Pt with Parkinson's dementia with worsening gait ataxia, presenting with overall generalized weakness, but endorses focal left hip pain currently, with possible left shoulder pain during presentation; no hx of trauma or recent falls. On exam, has decreased strength in LLE compared to RLE, but overall benign exam findings. Hip XR negative for hip fracture or dislocation. Possible MSK pain in pt with advanced Parkinson's, will continue to monitor. - MRI to evaluate for soft tissue injury - Voltaren and tylenol q6h for inflammatory pain - PT/OT eval, with recommended SNF placement    Hx of CAD s/p NSTEMI w/DES  Hyperlipidemia - Continue aspirin '81mg'$  daily - Held lipitor, can resume once paxlovid therapy is completed  Best Practice: Diet: dysphagia 3 IVF: None,None VTE: Enoxaparin 40 Code: DNR  Signature: MoLajean ManesMD  Internal Medicine Resident, PGY-1 [facility]  Pager: #[phone]/21/2022 10:29 AM After 5pm on weekdays and 1pm on weekends: On Call pager [phone]

## 2020-10-27 ENCOUNTER — Inpatient Hospital Stay (HOSPITAL_COMMUNITY): Payer: PPO

## 2020-10-27 DIAGNOSIS — U071 COVID-19: Secondary | ICD-10-CM | POA: Diagnosis not present

## 2020-10-27 DIAGNOSIS — M25559 Pain in unspecified hip: Secondary | ICD-10-CM | POA: Diagnosis not present

## 2020-10-27 LAB — BASIC METABOLIC PANEL
Anion gap: 7 (ref 5–15)
BUN: 11 mg/dL (ref 8–23)
CO2: 27 mmol/L (ref 22–32)
Calcium: 8.2 mg/dL — ABNORMAL LOW (ref 8.9–10.3)
Chloride: 102 mmol/L (ref 98–111)
Creatinine, Ser: 0.82 mg/dL (ref 0.61–1.24)
GFR, Estimated: >60 mL/min (ref >60)
Glucose, Bld: 102 mg/dL — ABNORMAL HIGH (ref 70–99)
Potassium: 3.7 mmol/L (ref 3.5–5.1)
Sodium: 136 mmol/L (ref 135–145)

## 2020-10-27 LAB — PHOSPHORUS: Phosphorus: 2.9 mg/dL (ref 2.5–4.6)

## 2020-10-27 LAB — CBC
HCT: 34.9 % — ABNORMAL LOW (ref 39.0–52.0)
Hemoglobin: 12.2 g/dL — ABNORMAL LOW (ref 13.0–17.0)
MCH: 32.6 pg (ref 26.0–34.0)
MCHC: 35 g/dL (ref 30.0–36.0)
MCV: 93.3 fL (ref 80.0–100.0)
Platelets: 164 10*3/uL (ref 150–400)
RBC: 3.74 MIL/uL — ABNORMAL LOW (ref 4.22–5.81)
RDW: 12.1 % (ref 11.5–15.5)
WBC: 3.2 10*3/uL — ABNORMAL LOW (ref 4.0–10.5)
nRBC: 0 % (ref 0.0–0.2)

## 2020-10-27 LAB — MAGNESIUM: Magnesium: 2 mg/dL (ref 1.7–2.4)

## 2020-10-27 MED ORDER — ACETAMINOPHEN 325 MG PO TABS
650.0000 mg | ORAL_TABLET | Freq: Four times a day (QID) | ORAL | Status: DC
  Administered 2020-10-27 – 2020-10-29 (×9): 650 mg via ORAL
  Filled 2020-10-27 (×9): qty 2

## 2020-10-27 MED ORDER — ACETAMINOPHEN 650 MG RE SUPP: 650.0000 mg | Freq: Four times a day (QID) | RECTAL | Status: DC

## 2020-10-27 NOTE — Progress Notes (Signed)
Pt transported to MRI 

## 2020-10-27 NOTE — Progress Notes (Signed)
Subjective: No acute overnight events. Patient was seen at bedside during rounds this morning. Pt reports feeling well this AM. Pt and family believe that his hip pain is better controlled with the q6h tylenol schedule. Pt denies SOB, cough, fever, CP, and confusion. Pt and family are anticipating that Marvin Ingram can be d/c to home today if home health is set up, as he has family at home to support him. No other complains or concerns at this time. Daughter-in-law states that they are looking forward to working with PT today.    Objective:  Vital signs in last 24 hours: Vitals:   10/26/20 2314 10/27/20 0350 10/27/20 0530 10/27/20 0751  BP: 110/70 (!) 164/93  130/84  Pulse: (!) 58 62  (!) 59  Resp: '16 19  17  '$ Temp: 98.9 F (37.2 C) 98.4 F (36.9 C)  97.9 F (36.6 C)  TempSrc: Oral Axillary  Oral  SpO2: 100% 100%  99%  Weight:   60.5 kg   Height:       Constitutional: pleasant elderly male, no acute distress, more interactive than yesterday Cardiovascular: Normal rate, regular rhythm, S1 and S2 present, no murmurs, rubs, gallops. Distal pulses intact.  Respiratory: No respiratory distress, no accessory muscle use. Lungs are clear to auscultation bilaterally. Musculoskeletal: Normal bulk; cogwheel rigidity noted in bilateral upper extremities. No peripheral edema noted. Mild tenderness to palpation over posterior aspect of left hip and greater trochanter.  Skin: Warm and dry. Seborrheic dermatitis on scalp. No rash or erythema noted.    Assessment/Plan:  Active Problems:   COVID-19 virus infection   Hip pain  Marvin Ingram is a 76 y.o. with pertinent PMH of Parkinson's dementia, CAD s/p NSTEMI with DES in 2007,hyperlipidemia, basal cell carcinoma who presented with sore throat, cough, generalized weakness and fever and admitted for COVID-19 infection.  MRI: -Mild left hip osteoarthritis with degenerative anterior superior labral tearing. No acute osseous abnormality. -Feathery  intramuscular edema within the left adductor muscles, left iliopsoas muscle, and partially visualized paraspinal musculature. This could represent myositis or muscle strain. -Mild left-sided subgluteus minimus bursitis.  COVID 19 Infection Remains asymptomatic. Today, pt remains afebrile, is bradycardic 55s but at his baseline, O2 sat 99-100 and he denies sxs. No agitation or delirium overnight or this AM. No concerning physical examination findings. SNF placement pending, however he may return home if home health is set up. Completed 5 day course of Paxlovid yesterday.  - Tussionex 56m q12h prn and Robitussin DM 162mq4h prn - Encourage ICS and flutter valve - Oxygen support prn for SpO2 >92%   Parkinson's dementia Physical deconditioning L Hip pain  Pt with Parkinson's dementia with gait ataxia, presenting with overall generalized weakness and focal left hip pain currently; no hx of trauma or recent falls. On exam, has decreased strength in LLE compared to RLE, but overall benign exam findings. Hip XR negative for hip fracture or dislocation. MRI shows no acute bony abnormality, mild left hip osteoarthritis with degenerative anterior superior labral tearing, and intramuscular edema within the left adductor muscles and left iliopsoas muscle, likely a muscle strain. There is mild left-sided subgluteus minimus bursitis. Will be treated conservatively with analgesia and PT.  - Voltaren and tylenol q6h for inflammatory pain - PT/OT eval, with recommended SNF placement    Hx of CAD s/p NSTEMI w/DES  Hyperlipidemia - Continue aspirin '81mg'$  daily - resume Lipitor, as paxlovid therapy is completed  Best Practice: Diet: dysphagia 3 IVF: None,None VTE: Enoxaparin  40 Code: DNR  Signature: Lajean Manes, MD  Internal Medicine Resident, PGY-1 [facility]  Pager: [phone] 10/28/2020 10:29 AM After 5pm on weekdays and 1pm on weekends: On Call pager [phone]

## 2020-10-28 DIAGNOSIS — M25559 Pain in unspecified hip: Secondary | ICD-10-CM | POA: Diagnosis not present

## 2020-10-28 DIAGNOSIS — U071 COVID-19: Secondary | ICD-10-CM | POA: Diagnosis not present

## 2020-10-28 LAB — CULTURE, BLOOD (ROUTINE X 2)
Culture: NO GROWTH
Culture: NO GROWTH
Special Requests: ADEQUATE

## 2020-10-28 LAB — PHOSPHORUS: Phosphorus: 3 mg/dL (ref 2.5–4.6)

## 2020-10-28 LAB — MAGNESIUM: Magnesium: 2.1 mg/dL (ref 1.7–2.4)

## 2020-10-28 MED ORDER — ATORVASTATIN CALCIUM 10 MG PO TABS
5.0000 mg | ORAL_TABLET | Freq: Every day | ORAL | Status: DC
  Administered 2020-10-28 – 2020-10-31 (×4): 5 mg via ORAL
  Filled 2020-10-28 (×4): qty 1

## 2020-10-28 MED ORDER — ASPIRIN 81 MG PO CHEW
162.0000 mg | CHEWABLE_TABLET | Freq: Every day | ORAL | Status: DC
  Administered 2020-10-28 – 2020-11-03 (×7): 162 mg via ORAL
  Filled 2020-10-28 (×7): qty 2

## 2020-10-28 NOTE — TOC Progression Note (Addendum)
Transition of Care [facility]) - Progression Note    Patient Details  Name: Marvin Ingram MRN: [id number] Date of Birth: 1945/05/31  Transition [facility]) CM/SW Contact  Bartholomew Crews, RN Phone Number: [phone] 10/28/2020, 1:31 PM  Clinical Narrative:     Spoke with patient's spouse, [name], on her mobile phone to discuss transition home when medically ready. [name] would like to bring "[name]" home rather than have him go to a facility. [name] is interested in home health services and has already called [employer] concierge to discuss benefits and in network providers.   Three way call to [facility] to verify [facility] vs custodial benefits and in network providers. Reviewed with [name] the difference between [facility] and custodial. [name] stated that she wants him to have have at a minimum PT and Aide, and that she is not concerned about having custodial benefits stating that she and her son and daughter in law have 24/7 covered at this time.   Spoke with Alyse Low at [facility]. Referral accepted for PT, OT, Aide, and SW. Requested HH orders from MD.   Wife would like patient to have a wheelchair per PT recommendations. Advised that [facility] is in network. Verified with Loma Sousa at [facility]. Referral to [facility] for delivery to the room. Patient already has a cane, RW, and BSC.  Family to provide transportation home at this time. Advised that if patient needed nonurgent stretcher transport that prior auth from [place] would be needed.   TOC following for transition needs.   1551 Update: Received call from [facility] that they would like to pursue SNF for short-term for the end of the week when he is out of his 10-day quarantine. [name] has researched SNFs and requests her preferences for [facility]; [facility]; [facility]; [place]; and [facility].   Expected Discharge Plan: [place] Barriers to Discharge: Continued Medical Work up  Expected  Discharge Plan and Services Expected Discharge Plan: [place] In-house Referral: Clinical Social Work Discharge Planning Services: CM Consult Post Acute Care Choice: Home Health, Durable Medical Equipment Living arrangements for the past 2 months: Single Family Home                 DME Arranged: Wheelchair manual DME Agency: [facility] Date DME Agency Contacted: 10/28/20 Time DME Agency Contacted: [id number] Representative spoke with at DME Agency: Freda Munro HH Arranged: PT, OT, Nurse's Aide, Social Work [employer] Agency: [facility] Date [place]: 10/28/20 Time [place]: [place] Representative spoke with at [place]: [place] Interventions    Readmission Risk Interventions No flowsheet data found.

## 2020-10-28 NOTE — Plan of Care (Signed)
  Problem: Coping: Goal: Level of anxiety will decrease Outcome: Progressing   Problem: Elimination: Goal: Will not experience complications related to bowel motility Outcome: Progressing   Problem: Activity: Goal: Risk for activity intolerance will decrease Outcome: Not Progressing

## 2020-10-28 NOTE — NC FL2 (Addendum)
[place] LEVEL OF CARE SCREENING TOOL     IDENTIFICATION  Patient Name: Marvin Ingram Birthdate: January 08, 1946 Sex: male Admission Date (Current Location): 10/23/2020  [facility] and [place] Number:  Herbalist and Address:  [facility] [place]. 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   Palpitations 01/23/2016   CAD (coronary artery disease) of artery bypass graft 01/19/2014   Hyperlipidemia 01/19/2014    Orientation RESPIRATION BLADDER Height & Weight     Self, Time, Situation, Place  Normal Continent, External catheter Weight: 133 lb 6.1 oz (60.5 kg) Height:  5' 9.5" (176.5 cm)  BEHAVIORAL SYMPTOMS/MOOD NEUROLOGICAL BOWEL NUTRITION STATUS      Continent Diet (see d/c summary)  AMBULATORY STATUS COMMUNICATION OF NEEDS Skin   Extensive Assist Verbally Normal                       Personal Care Assistance Level of Assistance  Bathing, Feeding, Dressing Bathing Assistance: Maximum assistance Feeding assistance: Independent Dressing Assistance: Maximum assistance     Functional Limitations Info  Sight, Hearing, Speech Sight Info: Impaired Hearing Info: Adequate Speech Info: Adequate    SPECIAL CARE FACTORS FREQUENCY  PT (By licensed PT), OT (By licensed OT)     PT Frequency: 5x/week OT Frequency: 5x/week            Contractures Contractures Info: Not present    Additional Factors Info  Code  Status, Allergies, Isolation Precautions Code Status Info: DNR Allergies Info: Amoxicillin, Doxycycline, Gatifloxacin, prednisone     solation Precautions Info: Covid positive; 8/26 will be 10th day from date; seeking a snf for that time.     Current Medications (10/28/2020):  This is the current hospital active medication list Current Facility-Administered Medications  Medication Dose Route Frequency Provider Last Rate Last Admin   acetaminophen (TYLENOL) tablet 650 mg  650 mg Oral Q6H Lajean Manes, MD   650 mg at 10/28/20 1235   Or   acetaminophen (TYLENOL) suppository 650 mg  650 mg Rectal Q6H Lajean Manes, MD       aspirin chewable tablet 162 mg  162 mg Oral Daily Lajean Manes, MD   162 mg at 10/28/20 1235   atorvastatin (LIPITOR) tablet 5 mg  5 mg Oral Daily Lajean Manes, MD   5 mg at 10/28/20 1234   chlorpheniramine-HYDROcodone (TUSSIONEX) 10-8 MG/5ML suspension 5 mL  5 mL Oral Q12H PRN Aslam, Loralyn Freshwater, MD       diclofenac Sodium (VOLTAREN) 1 % topical gel 2 g  2 g Topical TID Virl Axe, MD   2 g at 10/28/20 0911   enoxaparin (LOVENOX) injection 40 mg  40 mg Subcutaneous Q24H Aslam, Sadia, MD   40 mg at [id number]   folic acid (FOLVITE) tablet 1 mg  1 mg Oral Daily Aslam, Sadia, MD   1 mg at 10/28/20 0911   guaiFENesin-dextromethorphan (ROBITUSSIN DM) 100-10 MG/5ML syrup  10 mL  10 mL Oral Q4H PRN Aslam, Loralyn Freshwater, MD       multivitamin with minerals tablet 1 tablet  1 tablet Oral Daily Aslam, Sadia, MD   1 tablet at 10/28/20 0911   polyethylene glycol (MIRALAX / GLYCOLAX) packet 17 g  17 g Oral Daily PRN Lajean Manes, MD   17 g at 10/27/20 [id number]   sodium chloride flush (NS) 0.9 % injection 3 mL  3 mL Intravenous Q12H Aslam, Loralyn Freshwater, MD   3 mL at 10/27/20 2129   thiamine tablet 100 mg  100 mg Oral Daily Harvie Heck, MD   100 mg at 10/28/20 0911     Discharge Medications: Please see discharge summary for a list of discharge medications.  Relevant Imaging Results:  Relevant Lab  Results:   Additional Information SSN [id number]  Bethann Berkshire, LCSW

## 2020-10-28 NOTE — Progress Notes (Signed)
  Speech Language Pathology Treatment: Dysphagia  Patient Details Name: Marvin Ingram MRN: [id number] DOB: 09-Feb-1946 Today's Date: 10/28/2020 Time: 9924-2683 SLP Time Calculation (min) (ACUTE ONLY): 26 min  Assessment / Plan / Recommendation Clinical Impression  Marvin Ingram is doing well with his swallowing - there was adequate mastication of regular solids; there were no s/s of aspiration with consumption of thin liquids.  Swallow and respiratory coordination were WNL. Voice is hypophonic; he demonstrates hypomimia. Per flowsheet, lung sounds have been consistent since admission.  There are no current concerns for dysphagia.  Spoke with Marvin Ingram and his dtr-in-law, Marvin Ingram, who was at bedside, as well as Marvin Ingram via phone about swallowing and Parkinson's.  We discussed the impact of PD on swallowing and the potential for aspiration and pna as the disease progresses.  We talked about the benefits of exercise (for swallowing, speech/voice, and mobility) that may help to stave off the deteriorating effects of PD and the movement disorder clinics in both [place] that may be beneficial to investigate.  Mr. and Marvin Ingram verbalized understanding. There are no further acute care SLP needs - our service will sign off.    HPI HPI: Pt is a 75 y.o. male who presents to [facility] with productive cough, sore throat, congestion and generalized weakness. He reports feeling well up until one week prior when he started feeling weaker with worsening gait and left sided hip pain. He denies any falls. He reports decreased oral intake during this time as well. CXR (10/23/20) negative for any focal pneumonia or other acute finding. Pt found to be COVID positive (10/23/20) and admitted for further management.  PMH: Parkinson's dementia and CAD s/p NSTEMI in 2007 with DES to LAD, hyperlipidemia, basal cell carcinoma, arthritis s/p R knee replacement, and bradycardia.      SLP Plan  Discharge SLP treatment due  to (comment) Goal met       Recommendations  Diet recommendations: Regular;Thin liquid Liquids provided via: Cup;Straw Medication Administration: Whole meds with liquid Supervision: Patient able to self feed Compensations: Minimize environmental distractions Postural Changes and/or Swallow Maneuvers: Seated upright 90 degrees                Oral Care Recommendations: Oral care BID Follow up Recommendations: Other (comment) (Pt would benefit from PD clinic) SLP Visit Diagnosis: Dysphagia, unspecified (R13.10) Plan: Discharge SLP treatment due to (comment)       GO               Marvin Ingram, [place] Office number [phone] Pager [phone]  Marvin Ingram 10/28/2020, 1:18 PM

## 2020-10-28 NOTE — Progress Notes (Signed)
    Durable Medical Equipment  (From admission, onward)           Start     Ordered   10/28/20 1304  For home use only DME lightweight manual wheelchair with seat cushion  Once       Comments: Patient suffers from Parkinson's and Covid which impairs their ability to perform daily activities like bathing, dressing, grooming, and toileting in the home.  A cane, crutch, or walker will not resolve  issue with performing activities of daily living. A wheelchair will allow patient to safely perform daily activities. Patient is not able to propel themselves in the home using a standard weight wheelchair due to endurance and general weakness. Patient can self propel in the lightweight wheelchair. Length of need 6 months . Accessories: elevating leg rests (ELRs), wheel locks, extensions and anti-tippers.   10/28/20 1305

## 2020-10-28 NOTE — Progress Notes (Signed)
Physical Therapy Treatment Patient Details Name: Marvin Ingram MRN: [id number] DOB: 1945-11-23 Today's Date: 10/28/2020    History of Present Illness Marvin Ingram is a 75 y.o. male with a pertinent PMH of Parkinson's dementia and CAD s/p NSTEMI in 2007 with DES to LAD, hyperlipidemia, basal cell carcinoma, arthritis s/p R knee replacement, and bradycardia who presents to [facility] with one week of productive cough, sore throat, congestion and generalized weakness.    PT Comments    Patient making progress, however still required moderate assist for sit to stand and to get his balance in standing (due to strong posterior bias on initial standing). Required moderate cues for ambulation to widen BOS and to increase step length. Patient moves slowly due to Parkinson's, requiring incr time for all mobility. At end of session, discussed discharge plan with pt, wife (via speaker phone) and daughter in law. Ultimately family decided that SNF would be the best plan for now until pt (and wife) get stronger. Noted family has notified TOC team regarding their change in plan.    Follow Up Recommendations  SNF     Equipment Recommendations  Wheelchair (measurements PT);Wheelchair cushion (measurements PT)    Recommendations for Other Services       Precautions / Restrictions Precautions Precautions: Fall Precaution Comments: airborne    Mobility  Bed Mobility Overal bed mobility: Needs Assistance Bed Mobility: Rolling;Sidelying to Sit;Sit to Sidelying Rolling: Supervision (with rail and cues) Sidelying to sit: Min assist     Sit to sidelying: Min assist General bed mobility comments: incr time; moderate cues for sequencing; assist to raise torso to sit; assist to raise legs onto bed to sidelying    Transfers Overall transfer level: Needs assistance Equipment used: Rolling walker (2 wheeled) Transfers: Sit to/from [facility] Sit to Stand: Mod assist;Min assist          General transfer comment: initial transfer with strong posterior bias and required ~90 seconds of working on balance/wt-shifting to get pt centered over his feet prior to ambulation; second stand from [facility] with less posterior bias  Ambulation/Gait Ambulation/Gait assistance: Min assist Gait Distance (Feet): 30 Feet (seated rest; 10 ft) Assistive device: Rolling walker (2 wheeled) Gait Pattern/deviations: Step-to pattern;Decreased stride length;Shuffle;Trunk flexed;Narrow base of support;Step-through pattern Gait velocity: very slow Gait velocity interpretation: <1.8 ft/sec, indicate of risk for recurrent falls General Gait Details: steadying assist due to narrow BOS; assist to maneuver RW (especially when turning); max cues for increasing step length and foot clearance   Stairs             Wheelchair Mobility    Modified Rankin (Stroke Patients Only)       Balance Overall balance assessment: Needs assistance Sitting-balance support: Feet supported Sitting balance-Leahy Scale: Fair     Standing balance support: Bilateral upper extremity supported Standing balance-Leahy Scale: Poor Standing balance comment: UE support and assist for balance in standing                            Cognition Arousal/Alertness: Awake/alert Behavior During Therapy: Flat affect Overall Cognitive Status: History of cognitive impairments - at baseline                                 General Comments: slow processing at baseline      Exercises General Exercises - Lower Extremity Ankle Circles/Pumps: PROM;AROM;Both;10 reps;Supine Heel  Slides: AAROM;Both;5 reps;Supine Hip ABduction/ADduction: AAROM;Both;5 reps;Supine    General Comments General comments (skin integrity, edema, etc.): Daughter-in-law present through most of session (stepped out while pt using bathroom). As discussing discharge plans, called pt's wife and had her (and a son) on speaker phone. Lengthy  discussion regarding pro's and con's of either home with [place] vs SNF and ultimately pt and wife agreed that SNF is the best plan with regards to pt's safety (and wife's).      Pertinent Vitals/Pain Pain Assessment: 0-10 Pain Score: 4  Pain Location: left hip Pain Descriptors / Indicators: Discomfort;Sore Pain Intervention(s): Limited activity within patient's tolerance;Monitored during session;Premedicated before session;Repositioned    Home Living                      Prior Function            PT Goals (current goals can now be found in the care plan section) Acute Rehab PT Goals Patient Stated Goal: to go home PT Goal Formulation: With patient Time For Goal Achievement: 11/07/20 Potential to Achieve Goals: Good Progress towards PT goals: Progressing toward goals    Frequency    Min 2X/week      PT Plan Current plan remains appropriate    Co-evaluation              AM-PAC PT "6 Clicks" Mobility   Outcome Measure  Help needed turning from your back to your side while in a flat bed without using bedrails?: A Little Help needed moving from lying on your back to sitting on the side of a flat bed without using bedrails?: A Little Help needed moving to and from a bed to a chair (including a wheelchair)?: A Lot Help needed standing up from a chair using your arms (e.g., wheelchair or bedside chair)?: A Lot Help needed to walk in hospital room?: A Little Help needed climbing 3-5 steps with a railing? : Total 6 Click Score: 14    End of Session Equipment Utilized During Treatment: Gait belt Activity Tolerance: Patient limited by fatigue Patient left: in bed;with call bell/phone within reach;with bed alarm set;with family/visitor present Nurse Communication: Mobility status;Other (comment) (family does want SNF) PT Visit Diagnosis: Other abnormalities of gait and mobility (R26.89);Muscle weakness (generalized) (M62.81);Other symptoms and signs involving the  nervous system (R29.898)     Time: [id number] PT Time Calculation (min) (ACUTE ONLY): 78 min  Charges:  $Gait Training: 23-37 mins $Therapeutic Activity: 8-22 mins $Self Care/Home Management: 23-37                      Marvin Ingram, PT Pager [phone]    Marvin Mano 10/28/2020, 4:31 PM

## 2020-10-28 NOTE — Progress Notes (Addendum)
   Subjective: No acute overnight events. Patient was seen at bedside during rounds this morning. Pt reports feeling well this AM, but had a subjective fever earlier. Pt denies cough, SOB and confusion. Plan is for SNF by the end of the week/ beginning of next week, family is also on board. No other complains or concerns at this time.    Objective:  Vital signs in last 24 hours: Vitals:   10/28/20 2329 10/29/20 0417 10/29/20 0800 10/29/20 1308  BP: 134/86 140/89 135/90 102/73  Pulse: 65 76 72 87  Resp: '16 19 20 20  '$ Temp: 97.8 F (36.6 C) 98 F (36.7 C) 98.4 F (36.9 C) 98.2 F (36.8 C)  TempSrc: Axillary Axillary Oral Oral  SpO2: 100% 100%    Weight:      Height:       Constitutional: pleasant elderly male, no acute distress, remains interactive  Cardiovascular: Normal rate, regular rhythm, S1 and S2 present, no murmurs, rubs, gallops. Distal pulses intact.  Respiratory: No respiratory distress, no accessory muscle use. Lungs are clear to auscultation bilaterally. Musculoskeletal: Normal bulk; cogwheel rigidity noted in bilateral upper extremities. No peripheral edema noted. Mild tenderness to palpation over posterior aspect of left hip and greater trochanter.  Skin: Warm and dry. Seborrheic dermatitis on scalp. No rash or erythema noted.    Assessment/Plan:  Active Problems:   COVID-19 virus infection   Hip pain  Marvin Ingram is a 75 y.o. with pertinent PMH of Parkinson's dementia, CAD s/p NSTEMI with DES in 2007, HLD, basal cell carcinoma who presented with sore throat, cough, generalized weakness and fever and admitted for COVID-19 infection.  COVID 19 Infection Remains asymptomatic. Family notes subjective fever earlier; pt remains afebrile, bradycardic 55s but at baseline, O2 sat 99-100 and he denies sxs. No concerning physical examination findings. SNF placement pending. Completed 5 day course of Paxlovid yesterday. Continuing to make progress with PT/OT.  - Tussionex  90m q12h prn and Robitussin DM 166mq4h prn - Encourage ICS and flutter valve - Oxygen support prn for SpO2 >92%   Parkinson's dementia Physical deconditioning L Hip pain  Pt with Parkinson's dementia with gait ataxia, presenting with overall generalized weakness and focal left hip pain controlled well with tylenol; no hx of trauma or recent falls. Strength remains LLE <  RLE, but overall benign exam findings. Hip XR negative for hip fracture or dislocation. MRI shows no acute bony abnormality, mild left hip osteoarthritis with degenerative anterior superior labral tearing, and intramuscular edema within the left adductor muscles and left iliopsoas muscle, likely a muscle strain, but viral myositis is a possibility given COVID infection. There is mild left-sided subgluteus minimus bursitis. Will continue treating conservatively with analgesics and PT.  - Voltaren and tylenol q6h for inflammatory pain - PT/OT eval, recommending SNF placement. Continues to make progress with PT/OT.    Hx of CAD s/p NSTEMI w/DES  HLD - Continue aspirin '81mg'$  daily - resumed Lipitor, as paxlovid therapy is completed  Best Practice: Diet: dysphagia 3 IVF: None,None VTE: Enoxaparin 40 Code: DNR  Signature: MoLajean ManesMD  Internal Medicine Resident, PGY-1 [facility]  Pager: #[phone]/23/2022 10:49 AM After 5pm on weekdays and 1pm on weekends: On Call pager [phone]

## 2020-10-29 DIAGNOSIS — U071 COVID-19: Secondary | ICD-10-CM | POA: Diagnosis not present

## 2020-10-29 MED ORDER — ACETAMINOPHEN 500 MG PO TABS
1000.0000 mg | ORAL_TABLET | Freq: Four times a day (QID) | ORAL | Status: DC
  Administered 2020-10-29 – 2020-10-30 (×3): 1000 mg via ORAL
  Filled 2020-10-29 (×3): qty 2

## 2020-10-29 MED ORDER — ACETAMINOPHEN 650 MG RE SUPP: 650.0000 mg | Freq: Four times a day (QID) | RECTAL | Status: DC

## 2020-10-29 NOTE — Progress Notes (Signed)
   Subjective:  No acute overnight events.  Doing well this AM. Still having some hip pain. He was able to eat and drink on his own.   Plan still for SNF when able. No other complains or concerns at this time.   Objective:  Vital signs in last 24 hours: Vitals:   10/28/20 2329 10/29/20 0417 10/29/20 0800 10/29/20 1308  BP: 134/86 140/89 135/90 102/73  Pulse: 65 76 72 87  Resp: '16 19 20 20  '$ Temp: 97.8 F (36.6 C) 98 F (36.7 C) 98.4 F (36.9 C) 98.2 F (36.8 C)  TempSrc: Axillary Axillary Oral Oral  SpO2: 100% 100%    Weight:      Height:       Constitutional: pleasant elderly male, no acute distress, remains interactive  Cardiovascular: Normal rate, regular rhythm, S1 and S2 present, no murmurs, rubs, gallops. Distal pulses intact.  Respiratory: No respiratory distress, no accessory muscle use. Lungs are clear to auscultation bilaterally. Musculoskeletal: Normal bulk; cogwheel rigidity noted in bilateral upper extremities. No peripheral edema noted. Mild tenderness to palpation over posterior aspect of left hip and greater trochanter.  Skin: Warm and dry. Seborrheic dermatitis on scalp. No rash or erythema noted.    Assessment/Plan:  Active Problems:   COVID-19 virus infection   Hip pain  Marvin REH is a 75 y.o. with pertinent PMH of Parkinson's dementia, CAD s/p NSTEMI with DES in 2007, HLD, basal cell carcinoma who presented with sore throat, cough, generalized weakness and fever and admitted for COVID-19 infection.  COVID 19 Infection Remains asymptomatic. Denies subjective fevers today; pt remains afebrile, bradycardic 55s but at baseline, O2 sat 99-100 and he denies sxs. No concerning physical examination findings. Completed 5 day course of Paxlovid 8/22. SNF placement pending.  - Tussionex 54m q12h prn and Robitussin DM 163mq4h prn - Encourage ICS and flutter valve - Oxygen support prn for SpO2 >92%   Parkinson's dementia Physical deconditioning L Hip  pain  Overall generalized weakness and focal left hip pain remains controlled with tylenol; no hx of trauma or recent falls. Strength remains LLE <  RLE, but overall benign exam findings. Hip XR negative. MRI shows no acute bony abnormality, mild left hip osteoarthritis with degenerative anterior superior labral tearing, and intramuscular edema within the left adductor muscles and left iliopsoas muscle, likely a muscle strain, but viral myositis is a possibility given COVID infection. There is mild left-sided subgluteus minimus bursitis. Will continue treating conservatively with analgesics and PT. Complains of constipation, encouraged mirilax and fiber rich diet.  - Voltaren and tylenol q6h for inflammatory pain - PT/OT eval, with recommended SNF placement    Hx of CAD s/p NSTEMI w/DES  HLD - Continue aspirin '81mg'$  daily - resumed Lipitor, as paxlovid therapy is completed  Best Practice: Diet: dysphagia 3 IVF: None,None VTE: Enoxaparin 40 Code: DNR  Signature: MoLajean ManesMD  Internal Medicine Resident, PGY-1 [facility]  Pager: #[phone]/24/2022 After 5pm on weekdays and 1pm on weekends: On Call pager [phone]

## 2020-10-29 NOTE — Progress Notes (Signed)
Occupational Therapy Treatment Patient Details Name: Marvin Ingram MRN: [id number] DOB: 1945/08/04 Today's Date: 10/29/2020    History of present illness MAX SWEENY is a 75 y.o. male with a pertinent PMH of Parkinson's dementia and CAD s/p NSTEMI in 2007 with DES to LAD, hyperlipidemia, basal cell carcinoma, arthritis s/p R knee replacement, and bradycardia who presents to [facility] with one week of productive cough, sore throat, congestion and generalized weakness. (Simultaneous filing. User may not have seen previous data.)   OT comments  Patient supine and in good spirits.  VCS and min assist to get from supine to sitting on EOB. VCS for pushing up to stand and min assist.  VCS for safety with walker use to ambulate to sink for hand and face hygiene.  VCS for sequencing  and min assist to use soap and retrieve paper towel.  Patient amublated to chair for meal with min assist and VCS for safety and to reach back to sit. Daughter-in-law present during treatment.    Follow Up Recommendations  SNF;Supervision/Assistance - 24 hour    Equipment Recommendations  Other (comment) (defer to next venue)    Recommendations for Other Services      Precautions / Restrictions Precautions Precautions: Fall (Simultaneous filing. User may not have seen previous data.) Precaution Comments: airborne (Simultaneous filing. User may not have seen previous data.)       Mobility Bed Mobility Overal bed mobility: Needs Assistance (Simultaneous filing. User may not have seen previous data.) Bed Mobility: Rolling;Sidelying to Sit (Simultaneous filing. User may not have seen previous data.) Rolling: Supervision (with rail and cues  Simultaneous filing. User may not have seen previous data.) Sidelying to sit: Min assist (Simultaneous filing. User may not have seen previous data.) Supine to sit: Mod assist   Sit to sidelying: Min assist General bed mobility comments: incr time; moderate cues for sequencing;  min assist with scooting forward (Simultaneous filing. User may not have seen previous data.)    Transfers Overall transfer level: Needs assistance (Simultaneous filing. User may not have seen previous data.) Equipment used: Rolling walker (2 wheeled) (Simultaneous filing. User may not have seen previous data.) Transfers: Sit to/from Stand (Simultaneous filing. User may not have seen previous data.) Sit to Stand: Min assist (Simultaneous filing. User may not have seen previous data.) Stand pivot transfers: Min assist;Min guard       General transfer comment: Patient required vcs for hand placement for sit to stand, walker use, and safety (Simultaneous filing. User may not have seen previous data.)    Balance Overall balance assessment: Needs assistance (Simultaneous filing. User may not have seen previous data.) Sitting-balance support: Feet supported (Simultaneous filing. User may not have seen previous data.) Sitting balance-Leahy Scale: Fair (Simultaneous filing. User may not have seen previous data.)     Standing balance support: Bilateral upper extremity supported (Simultaneous filing. User may not have seen previous data.) Standing balance-Leahy Scale: Fair (Simultaneous filing. User may not have seen previous data.) Standing balance comment: UE support and assist for balance in standing (Simultaneous filing. User may not have seen previous data.)                           ADL either performed or assessed with clinical judgement   ADL Overall ADL's : Needs assistance/impaired Eating/Feeding: Minimal assistance;Bed level   Grooming: Sitting;Minimal assistance;Standing Grooming Details (indicate cue type and reason): verbal cues for sequencing Upper Body Bathing: Maximal assistance;Sitting  Lower Body Bathing: Total assistance;Sit to/from stand   Upper Body Dressing : Moderate assistance;Sitting   Lower Body Dressing: Total assistance;Sit to/from stand        Toileting- Water quality scientist and Hygiene: Total assistance;Sit to/from stand               Vision       Perception     Praxis      Cognition Arousal/Alertness: Awake/alert (Simultaneous filing. User may not have seen previous data.) Behavior During Therapy: Flat affect (Simultaneous filing. User may not have seen previous data.) Overall Cognitive Status: History of cognitive impairments - at baseline (Simultaneous filing. User may not have seen previous data.)                                 General Comments: slow processing at baseline (Simultaneous filing. User may not have seen previous data.)        Exercises Exercises: General Lower Extremity General Exercises - Lower Extremity Ankle Circles/Pumps: PROM;AROM;Both;10 reps;Supine Heel Slides: AAROM;Both;5 reps;Supine Hip ABduction/ADduction: AAROM;Both;5 reps;Supine   Shoulder Instructions       General Comments      Pertinent Vitals/ Pain       Faces Pain Scale: Hurts a little bit Pain Location: left hip (Simultaneous filing. User may not have seen previous data.) Pain Descriptors / Indicators: Discomfort;Sore (Simultaneous filing. User may not have seen previous data.)  Home Living                                          Prior Functioning/Environment              Frequency  Min 2X/week        Progress Toward Goals  OT Goals(current goals can now be found in the care plan section)  Progress towards OT goals: Progressing toward goals  Acute Rehab OT Goals Patient Stated Goal: to go home (Simultaneous filing. User may not have seen previous data.) OT Goal Formulation: With patient Time For Goal Achievement: 11/07/20 Potential to Achieve Goals: Fair ADL Goals Pt Will Perform Grooming: with min assist;standing Pt Will Transfer to Toilet: with min assist;ambulating;bedside commode Pt/caregiver will Perform Home Exercise Program: Increased ROM;Increased  strength;Both right and left upper extremity;With minimal assist (shoulder ROM) Additional ADL Goal #1: Pt will perform bed mobility with moderate assistance in preparation for ADL.  Plan      Co-evaluation                 AM-PAC OT "6 Clicks" Daily Activity     Outcome Measure   Help from another person eating meals?: A Little Help from another person taking care of personal grooming?: A Little Help from another person toileting, which includes using toliet, bedpan, or urinal?: A Little Help from another person bathing (including washing, rinsing, drying)?: A Little Help from another person to put on and taking off regular upper body clothing?: A Little Help from another person to put on and taking off regular lower body clothing?: Total 6 Click Score: 16    End of Session Equipment Utilized During Treatment: Rolling walker;Gait belt  OT Visit Diagnosis: Unsteadiness on feet (R26.81);Other abnormalities of gait and mobility (R26.89);Pain;Muscle weakness (generalized) (M62.81);Other symptoms and signs involving cognitive function   Activity Tolerance Patient tolerated treatment well   Patient Left  with family/visitor present;with call bell/phone within reach;in chair;with chair alarm set   Nurse Communication Mobility status        Time: [id number] OT Time Calculation (min): 16 min  Charges: OT Treatments $Self Care/Home Management : 8-22 mins  Lodema Hong, OTA    Kaipo Ardis Alexis Goodell 10/29/2020, 1:05 PM

## 2020-10-30 DIAGNOSIS — U071 COVID-19: Secondary | ICD-10-CM | POA: Diagnosis not present

## 2020-10-30 DIAGNOSIS — M25559 Pain in unspecified hip: Secondary | ICD-10-CM | POA: Diagnosis not present

## 2020-10-30 MED ORDER — ACETAMINOPHEN 500 MG PO TABS
1000.0000 mg | ORAL_TABLET | Freq: Four times a day (QID) | ORAL | Status: DC | PRN
  Administered 2020-10-30 – 2020-11-03 (×9): 1000 mg via ORAL
  Filled 2020-10-30 (×9): qty 2

## 2020-10-30 MED ORDER — ACETAMINOPHEN 500 MG PO TABS: 1000.0000 mg | ORAL_TABLET | Freq: Four times a day (QID) | ORAL | Status: DC

## 2020-10-30 MED ORDER — ACETAMINOPHEN 650 MG RE SUPP: 650.0000 mg | Freq: Four times a day (QID) | RECTAL | Status: DC

## 2020-10-30 MED ORDER — BOOST / RESOURCE BREEZE PO LIQD CUSTOM
1.0000 | Freq: Three times a day (TID) | ORAL | Status: DC
Start: 2020-10-30 — End: 2020-10-31
  Administered 2020-10-30 – 2020-10-31 (×3): 1 via ORAL

## 2020-10-30 NOTE — Progress Notes (Signed)
  Date: 10/30/2020  Patient name: Marvin Ingram  Medical record number: [id number]  Date of birth: 12-08-1945        I have seen and evaluated this patient and I have discussed the plan of care with the house staff. Please see Dr. Serita Grit note for complete details. I concur with his findings and plan.   Sid Falcon, MD 10/30/2020, 2:59 PM

## 2020-10-30 NOTE — Progress Notes (Signed)
   Subjective:  No acute overnight events.  Doing well this AM. Still having some hip pain. He was able to eat and drink on his own.   Plan still for SNF when able. No other complains or concerns at this time.   Objective:  Vital signs in last 24 hours: Vitals:   10/30/20 2030 10/31/20 0003 10/31/20 0442 10/31/20 0816  BP: 119/73 (!) 163/86 129/79 (!) 146/88  Pulse: 64 79 74 85  Resp: '16 18 16 17  '$ Temp: 97.8 F (36.6 C) 98.6 F (37 C) 98.3 F (36.8 C) 98.2 F (36.8 C)  TempSrc: Oral Axillary Oral Oral  SpO2: 99% 98% 99% 99%  Weight:      Height:       Constitutional: pleasant elderly male, no acute distress, remains interactive  Cardiovascular: Normal rate, regular rhythm, S1 and S2 present, no murmurs, rubs, gallops. Distal pulses intact.  Respiratory: No respiratory distress, no accessory muscle use. Lungs are clear to auscultation bilaterally. Musculoskeletal: Normal bulk; cogwheel rigidity noted in bilateral upper extremities. No peripheral edema noted. Mild tenderness to palpation over posterior aspect of left hip and greater trochanter.  Skin: Warm and dry. Seborrheic dermatitis on scalp. No rash or erythema noted.    Assessment/Plan:  Active Problems:   COVID-19 virus infection   Hip pain  Marvin Ingram is a 75 y.o. with pertinent PMH of Parkinson's dementia, CAD s/p NSTEMI with DES in 2007, HLD, basal cell carcinoma who presented with sore throat, cough, generalized weakness and fever and admitted for COVID-19 infection.  COVID 19 Infection Remains asymptomatic. Denies subjective fevers today; pt remains afebrile, bradycardic 55s but at baseline, O2 sat 99-100 and he denies sxs. No concerning physical examination findings. Completed 5 day course of Paxlovid 8/22. SNF placement pending, possibly Saturday.  - Tussionex 80m q12h prn and Robitussin DM 195mq4h prn - Encourage ICS and flutter valve - Oxygen support prn for SpO2 >92%   Parkinson's  dementia Physical deconditioning L Hip pain  Overall generalized weakness and focal left hip pain remains controlled with tylenol; no hx of trauma or recent falls. Strength remains LLE <  RLE, but overall benign exam findings. Hip XR negative. MRI shows no acute bony abnormality, mild left hip osteoarthritis with degenerative anterior superior labral tearing, and intramuscular edema within the left adductor muscles and left iliopsoas muscle, likely a muscle strain, but viral myositis is a possibility given COVID infection. There is mild left-sided subgluteus minimus bursitis. Will continue treating conservatively with analgesics and PT. Pt continues to make progress, and is able to ambulate more compared to admission.  - Voltaren and tylenol q6h for inflammatory pain - PT/OT eval, with recommended SNF placement    Hx of CAD s/p NSTEMI w/DES  HLD - Continue aspirin '81mg'$  daily - resumed Lipitor, as paxlovid therapy is completed  Best Practice: Diet: dysphagia 3 IVF: None,None VTE: Enoxaparin 40 Code: DNR  Signature: MoLajean ManesMD  Internal Medicine Resident, PGY-1 [facility]  Pager: #[phone]/25/2022 After 5pm on weekdays and 1pm on weekends: On Call pager [phone]

## 2020-10-30 NOTE — Progress Notes (Signed)
Physical Therapy Treatment Patient Details Name: Marvin Ingram MRN: [id number] DOB: 1945/03/25 Today's Date: 10/30/2020    History of Present Illness Marvin Ingram is a 75 y.o. male with a pertinent PMH of Parkinson's dementia and CAD s/p NSTEMI in 2007 with DES to LAD, hyperlipidemia, basal cell carcinoma, arthritis s/p R knee replacement, and bradycardia who presents to [facility] with one week of productive cough, sore throat, congestion and generalized weakness.    PT Comments    Patient continues to require increased time due to Parkinson's. Able to get to EOB with supervision and transfer and ambulate with min assist. Continues to improve.     Follow Up Recommendations  SNF     Equipment Recommendations  Rolling walker with 5" wheels    Recommendations for Other Services       Precautions / Restrictions Precautions Precautions: Fall Precaution Comments: airborne    Mobility  Bed Mobility Overal bed mobility: Needs Assistance Bed Mobility: Rolling;Sidelying to Sit;Sit to Sidelying Rolling: Supervision (HOB flat; no rail; incr time and effort) Sidelying to sit: Min assist Supine to sit: Supervision (HOB flat; no rail; incr time and effort) Sit to supine: +2 for physical assistance;Max assist Sit to sidelying: Min assist General bed mobility comments: incr time; cues for sequencing    Transfers Overall transfer level: Needs assistance Equipment used: Rolling walker (2 wheeled) Transfers: Sit to/from Stand Sit to Stand: Min assist Stand pivot transfers: Min assist;Min guard       General transfer comment: posterior bias requiring assist to shift anteriorly over BOS  Ambulation/Gait Ambulation/Gait assistance: Min assist Gait Distance (Feet): 35 Feet Assistive device: Rolling walker (2 wheeled) Gait Pattern/deviations: Step-to pattern;Decreased stride length;Shuffle;Trunk flexed;Narrow base of support;Step-through pattern Gait velocity: very slow   General Gait  Details: steadying assist due to narrow BOS; assist to maneuver RW (especially when turning and around objects); max cues for increasing step length and foot clearance   Stairs             Wheelchair Mobility    Modified Rankin (Stroke Patients Only)       Balance Overall balance assessment: Needs assistance Sitting-balance support: Feet supported Sitting balance-Leahy Scale: Fair     Standing balance support: Bilateral upper extremity supported Standing balance-Leahy Scale: Poor Standing balance comment: UE support and assist for balance in standing                            Cognition Arousal/Alertness: Awake/alert Behavior During Therapy: Flat affect Overall Cognitive Status: History of cognitive impairments - at baseline                                 General Comments: slow processing at baseline      Exercises General Exercises - Lower Extremity Ankle Circles/Pumps: AROM;Both;10 reps;Supine Short Arc Quad: AROM;Both;10 reps Heel Slides: AAROM;Both;Supine;10 reps;Strengthening (resisted extension) Hip ABduction/ADduction: AAROM;Both;5 reps;Supine    General Comments        Pertinent Vitals/Pain Pain Assessment: Faces Faces Pain Scale: Hurts a little bit Pain Location: left hip Pain Descriptors / Indicators: Discomfort;Sore Pain Intervention(s): Limited activity within patient's tolerance;Monitored during session;Premedicated before session    Home Living                      Prior Function            PT Goals (  current goals can now be found in the care plan section) Acute Rehab PT Goals Patient Stated Goal: to go home PT Goal Formulation: With patient Time For Goal Achievement: 11/07/20 Potential to Achieve Goals: Good Progress towards PT goals: Progressing toward goals    Frequency    Min 2X/week      PT Plan Current plan remains appropriate    Co-evaluation              AM-PAC PT "6  Clicks" Mobility   Outcome Measure  Help needed turning from your back to your side while in a flat bed without using bedrails?: A Little Help needed moving from lying on your back to sitting on the side of a flat bed without using bedrails?: A Little Help needed moving to and from a bed to a chair (including a wheelchair)?: A Little Help needed standing up from a chair using your arms (e.g., wheelchair or bedside chair)?: A Little Help needed to walk in hospital room?: A Little Help needed climbing 3-5 steps with a railing? : Total 6 Click Score: 16    End of Session Equipment Utilized During Treatment: Gait belt Activity Tolerance: Patient tolerated treatment well Patient left: with call bell/phone within reach;with family/visitor present;in chair;with chair alarm set Nurse Communication: Mobility status PT Visit Diagnosis: Other abnormalities of gait and mobility (R26.89);Muscle weakness (generalized) (M62.81);Other symptoms and signs involving the nervous system (R29.898)     Time: [id number] PT Time Calculation (min) (ACUTE ONLY): 32 min  Charges:  $Gait Training: 8-22 mins $Therapeutic Exercise: 8-22 mins                      Arby Barrette, PT Pager [phone]    Rexanne Mano 10/30/2020, 1:21 PM

## 2020-10-31 DIAGNOSIS — U071 COVID-19: Secondary | ICD-10-CM | POA: Diagnosis not present

## 2020-10-31 DIAGNOSIS — M25559 Pain in unspecified hip: Secondary | ICD-10-CM | POA: Diagnosis not present

## 2020-10-31 MED ORDER — POLYETHYLENE GLYCOL 3350 17 G PO PACK
17.0000 g | PACK | Freq: Every day | ORAL | Status: DC
  Administered 2020-10-31 – 2020-11-03 (×4): 17 g via ORAL
  Filled 2020-10-31 (×4): qty 1

## 2020-10-31 MED ORDER — ENSURE ENLIVE PO LIQD
237.0000 mL | Freq: Three times a day (TID) | ORAL | Status: DC
  Administered 2020-11-01 – 2020-11-03 (×6): 237 mL via ORAL

## 2020-10-31 NOTE — Progress Notes (Signed)
Occupational Therapy Treatment Patient Details Name: Marvin Ingram MRN: [id number] DOB: Jul 12, 1945 Today's Date: 10/31/2020    History of present illness Marvin Ingram is a 75 y.o. male with a pertinent PMH of Parkinson's dementia and CAD s/p NSTEMI in 2007 with DES to LAD, hyperlipidemia, basal cell carcinoma, arthritis s/p R knee replacement, and bradycardia who presents to [facility] with one week of productive cough, sore throat, congestion and generalized weakness.   OT comments  Patient was min guard to get from supine to sitting on eob with vcs.  Min assist for sit to stand due to posterior leaning.  Patient ambulated to sink for self care with min assist/min guard and assistance for hand placement to sit at sink.  VCS and min assist with grooming for shaving and brushing teeth due to assistance needed with loading toothbrush. VCS for UB bathing with assistance to wash back.  Mod assist for LB bathing.  Patient ambulated to recliner with RW and min guard.  Daughter-in-law present during treatment.  Follow Up Recommendations  SNF;Supervision/Assistance - 24 hour    Equipment Recommendations  3 in 1 bedside commode;Tub/shower bench    Recommendations for Other Services      Precautions / Restrictions Precautions Precautions: Fall Precaution Comments: airborne Restrictions Weight Bearing Restrictions: No       Mobility Bed Mobility Overal bed mobility: Needs Assistance Bed Mobility: Rolling;Sidelying to Sit;Sit to Sidelying Rolling: Supervision Sidelying to sit: Min assist Supine to sit: Min guard     General bed mobility comments: incr time; cues for sequencing    Transfers Overall transfer level: Needs assistance Equipment used: Rolling walker (2 wheeled) Transfers: Sit to/from Stand Sit to Stand: Min assist Stand pivot transfers: Min assist;Min guard       General transfer comment: posterior lean with vcs to correct and min assist    Balance Overall balance  assessment: Needs assistance Sitting-balance support: Feet supported Sitting balance-Leahy Scale: Fair     Standing balance support: Bilateral upper extremity supported Standing balance-Leahy Scale: Fair Standing balance comment: UE support and assist for balance in standing                           ADL either performed or assessed with clinical judgement   ADL Overall ADL's : Needs assistance/impaired     Grooming: Sitting;Minimal assistance;Standing Grooming Details (indicate cue type and reason): verbal cues for sequencing (Patient stood for brushing teeth and washing face and shaving performed seated.) Upper Body Bathing: Minimal assistance;Sitting Upper Body Bathing Details (indicate cue type and reason):  (vcs for sequencing.) Lower Body Bathing: Moderate assistance;Sit to/from stand Lower Body Bathing Details (indicate cue type and reason):  (required assistance with bathing below knees) Upper Body Dressing : Moderate assistance;Sitting Upper Body Dressing Details (indicate cue type and reason):  (donned gown) Lower Body Dressing: Total assistance;Sit to/from stand                       Vision       Perception     Praxis      Cognition Arousal/Alertness: Awake/alert Behavior During Therapy: Flat affect Overall Cognitive Status: History of cognitive impairments - at baseline                                 General Comments: slow processing at baseline  Exercises     Shoulder Instructions       General Comments      Pertinent Vitals/ Pain       Pain Assessment: No/denies pain  Home Living                                          Prior Functioning/Environment              Frequency  Min 2X/week        Progress Toward Goals  OT Goals(current goals can now be found in the care plan section)  Progress towards OT goals: Progressing toward goals  Acute Rehab OT Goals Patient Stated  Goal: to go home OT Goal Formulation: With patient Time For Goal Achievement: 11/07/20 Potential to Achieve Goals: Fair ADL Goals Pt Will Perform Grooming: with supervision Pt Will Transfer to Toilet: with min assist;ambulating;bedside commode Pt/caregiver will Perform Home Exercise Program: Increased ROM;Increased strength;Both right and left upper extremity;With minimal assist Additional ADL Goal #1:  (Pt will perform bed mobility with supervision)  Plan Discharge plan remains appropriate    Co-evaluation                 AM-PAC OT "6 Clicks" Daily Activity     Outcome Measure   Help from another person eating meals?: A Little Help from another person taking care of personal grooming?: A Little Help from another person toileting, which includes using toliet, bedpan, or urinal?: A Little Help from another person bathing (including washing, rinsing, drying)?: A Little Help from another person to put on and taking off regular upper body clothing?: A Little Help from another person to put on and taking off regular lower body clothing?: Total 6 Click Score: 16    End of Session Equipment Utilized During Treatment: Gait belt;Rolling walker  OT Visit Diagnosis: Unsteadiness on feet (R26.81);Other abnormalities of gait and mobility (R26.89)   Activity Tolerance Patient tolerated treatment well   Patient Left with family/visitor present;with call bell/phone within reach;in chair;with chair alarm set   Nurse Communication Mobility status        Time: [id number] OT Time Calculation (min): 62 min  Charges: OT General Charges $OT Visit: 1 Visit OT Treatments $Self Care/Home Management : 53-67 mins  Lodema Hong, OTA    Marvin Ingram 10/31/2020, 1:32 PM

## 2020-10-31 NOTE — Progress Notes (Signed)
Initial Nutrition Assessment  DOCUMENTATION CODES:   Not applicable  INTERVENTION:   -D/c Boost Breeze -Ensure Enlive po TID, each supplement provides 350 kcal and 20 grams of protein  -Magic cup TID with meals, each supplement provides 290 kcal and 9 grams of protein  -Hormel Shake TID, each supplement provides 520 kcals and 22 grams protein -MVI with minerals daily  NUTRITION DIAGNOSIS:   Increased nutrient needs related to acute illness (COVID-19) as evidenced by estimated needs.  GOAL:   Patient will meet greater than or equal to 90% of their needs  MONITOR:   PO intake, Supplement acceptance, Diet advancement, Labs, Weight trends, Skin, I & O's  REASON FOR ASSESSMENT:   Malnutrition Screening Tool    ASSESSMENT:   Marvin Ingram is a 75 y.o. with pertinent PMH of Parkinson's dementia, CAD s/p NSTEMI with DES in 2007, HLD, basal cell carcinoma who presented with sore throat, cough, generalized weakness and fever and admitted for COVID-19 infection.  Pt admitted with COVID-19 infection.  Reviewed I/O's: -1.3 L x 24 hours and -9 L since admission  UOP: 1.4 L x 24 hours  Attempted to speak with pt x 2, however, unavailable at times of visits (pt either in with therapy of MD at visits).   Pt with very poor oral intake. Noted meal completions 10-30%. Per MD notes, pt is able to feed himself.   Per CareEverywhere, pt weighed 139# on 10/17/20. Pt has experienced a 7.6% wt loss over the past 2 weeks, which is significant for time frame.   Noted Boost Gwyneth Revels has been ordered, however, pt would benefit from a more nutrient-dense supplement due to poor oral intake.   Suspect pt with malnutrition, however, unable to identify at this time.   Pt awaiting SNF placement at discharge.  Medications reviewed and include miralax and thiamine.   Labs reviewed.    Diet Order:   Diet Order             DIET DYS 3 Room service appropriate? Yes; Fluid consistency: Thin  Diet  effective now                   EDUCATION NEEDS:   No education needs have been identified at this time  Skin:  Skin Assessment: Reviewed RN Assessment  Last BM:  10/31/20  Height:   Ht Readings from Last 1 Encounters:  10/23/20 5' 9.5" (1.765 m)    Weight:   Wt Readings from Last 1 Encounters:  10/30/20 58.2 kg    Ideal Body Weight:  74.1 kg  BMI:  Body mass index is 18.68 kg/m.  Estimated Nutritional Needs:   Kcal:  2050-2250  Protein:  115-130 grams  Fluid:  > 2 L    Loistine Chance, RD, LDN, [place] Registered Dietitian II Certified Diabetes Care and Education Specialist Please refer to [facility] for RD and/or RD on-call/weekend/after hours pager

## 2020-10-31 NOTE — Progress Notes (Signed)
Pt's daughter-in-law educated on need to wear appropriate PPE and to wear a mask at all times while in [place] pos room. Daughter-in-law nodded and verbally agreed to instructions.

## 2020-10-31 NOTE — Progress Notes (Signed)
   Subjective:  No acute overnight events.  Doing well this AM. Still having some hip pain. He was able to eat and drink on his own.   Plan still for SNF when able. No other complains or concerns at this time.  Objective:  Vital signs in last 24 hours: Vitals:   10/30/20 2030 10/31/20 0003 10/31/20 0442 10/31/20 0816  BP: 119/73 (!) 163/86 129/79 (!) 146/88  Pulse: 64 79 74 85  Resp: '16 18 16 17  '$ Temp: 97.8 F (36.6 C) 98.6 F (37 C) 98.3 F (36.8 C) 98.2 F (36.8 C)  TempSrc: Oral Axillary Oral Oral  SpO2: 99% 98% 99% 99%  Weight:      Height:       Constitutional: pleasant elderly male, no acute distress, remains interactive  Cardiovascular: Normal rate, regular rhythm, S1 and S2 present, no murmurs, rubs, gallops. Distal pulses intact.  Respiratory: No respiratory distress, no accessory muscle use. Lungs are clear to auscultation bilaterally. Musculoskeletal: Normal bulk; cogwheel rigidity noted in bilateral upper extremities. No peripheral edema noted. Mild tenderness to palpation over posterior aspect of left hip and greater trochanter.  Skin: Warm and dry. Seborrheic dermatitis on scalp. No rash or erythema noted.    Assessment/Plan:  Active Problems:   COVID-19 virus infection   Hip pain  Marvin Ingram is a 75 y.o. with pertinent PMH of Parkinson's dementia, CAD s/p NSTEMI with DES in 2007, HLD, basal cell carcinoma who presented with sore throat, cough, generalized weakness and fever and admitted for COVID-19 infection.  COVID 19 Infection Remains asymptomatic. Denies subjective fevers today; pt remains afebrile, bradycardic 55s but at baseline, O2 sat 99-100 and he denies sxs. No concerning physical examination findings. Completed 5 day course of Paxlovid 8/22. SNF placement pending, possibly Saturday. Wife wants hospice consult before committing to SNF. Patient is medically stable for d/c, and palliative and hospice consults are possible at a SNF.  - Tussionex  50m q12h prn and Robitussin DM 134mq4h prn - Encourage ICS and flutter valve - Oxygen support prn for SpO2 >92% - Contact wife and explain palliative and hospice can be done at a SNF.     Parkinson's dementia Physical deconditioning L Hip pain  Overall generalized weakness and focal left hip pain remains controlled with tylenol; no hx of trauma or recent falls. Strength remains LLE <  RLE, but overall benign exam findings. Hip XR negative. MRI shows no acute bony abnormality, mild left hip osteoarthritis with degenerative anterior superior labral tearing, and intramuscular edema within the left adductor muscles and left iliopsoas muscle, likely a muscle strain, but viral myositis is a possibility given COVID infection. There is mild left-sided subgluteus minimus bursitis. Will continue treating conservatively with analgesics and PT. Pt continues to make progress, and is able to ambulate more compared to admission.  - Voltaren and tylenol q6h for inflammatory pain - PT/OT eval, with recommended SNF placement    Hx of CAD s/p NSTEMI w/DES  HLD - Continue aspirin '81mg'$  daily - resumed Lipitor, as paxlovid therapy is completed  Best Practice: Diet: dysphagia 3 IVF: None,None VTE: Enoxaparin 40 Code: DNR  Signature: MoLajean ManesMD  Internal Medicine Resident, PGY-1 [facility]  Pager: #[phone] 5pm on weekdays and 1pm on weekends: On Call pager [phone]

## 2020-10-31 NOTE — TOC Progression Note (Signed)
Transition of Care [facility]) - Progression Note    Patient Details  Name: Marvin Ingram MRN: [id number] Date of Birth: March 12, 1945  Transition of Care [facility]) CM/SW [place], LCSW Phone Number: 10/31/2020, 11:25 AM  Clinical Narrative:    CSW still following for SNF bed availability; will be 11 days past COVID+ on Saturday. Patient's spouse's choices are [facility] (no beds yet but will keep CSW posted), [facility] (no beds), and [facility] (no beds).    Expected Discharge Plan: [place] Barriers to Discharge: Continued Medical Work up  Expected Discharge Plan and Services Expected Discharge Plan: [place] In-house Referral: Clinical Social Work Discharge Planning Services: CM Consult Post Acute Care Choice: Home Health, Durable Medical Equipment Living arrangements for the past 2 months: Single Family Home                 DME Arranged: Wheelchair manual DME Agency: [employer] Date DME Agency Contacted: 10/28/20 Time DME Agency Contacted: [id number] Representative spoke with at DME Agency: Freda Munro HH Arranged: PT, OT, Nurse's Aide, Social Work [employer] Agency: [facility] [place] Date [place]: 10/28/20 Time [place]: [place] Representative spoke with at [place]: [place] ([place]) Interventions    Readmission Risk Interventions No flowsheet data found.

## 2020-11-01 MED ORDER — ENSURE ENLIVE PO LIQD: 237.0000 mL | Freq: Three times a day (TID) | ORAL | 12 refills | Status: DC

## 2020-11-01 MED ORDER — FOLIC ACID 1 MG PO TABS: 1.0000 mg | ORAL_TABLET | Freq: Every day | ORAL | 0 refills | Status: AC

## 2020-11-01 MED ORDER — THIAMINE HCL 100 MG PO TABS: 100.0000 mg | ORAL_TABLET | Freq: Every day | ORAL | 0 refills | Status: AC

## 2020-11-01 MED ORDER — POLYETHYLENE GLYCOL 3350 17 G PO PACK: 17.0000 g | PACK | Freq: Every day | ORAL | 0 refills | Status: AC

## 2020-11-01 MED ORDER — ADULT MULTIVITAMIN W/MINERALS CH: 1.0000 | ORAL_TABLET | Freq: Every day | ORAL | 0 refills | Status: AC

## 2020-11-01 MED ORDER — CARBIDOPA-LEVODOPA 25-100 MG PO TABS: 1.0000 | ORAL_TABLET | Freq: Two times a day (BID) | ORAL | 0 refills | Status: AC

## 2020-11-01 NOTE — Discharge Summary (Addendum)
Name: Marvin Ingram MRN: [id number] DOB: 1945/11/14 75 y.o. PCP: [name], MD   Date of Admission: 10/23/2020  4:45 AM Date of Discharge:  11/02/20 Attending Physician: Dr. Heber [place]   DISCHARGE DIAGNOSIS:  Primary Problem: [place] [facility] Problems: Active Problems:   COVID-19 virus infection   Hip pain     DISCHARGE MEDICATIONS:    Allergies as of 11/01/2020         Reactions    Amoxicillin Diarrhea    Doxycycline Diarrhea    Gatifloxacin Diarrhea    Prednisone              Medication List       STOP taking these medications     ibuprofen 200 MG tablet Commonly known as: ADVIL           TAKE these medications     acetaminophen 500 MG tablet Commonly known as: TYLENOL Take 500 mg by mouth every 6 (six) hours as needed (for pain/headache).    aspirin EC 81 MG tablet Take 162 mg by mouth daily.    atorvastatin 10 MG tablet Commonly known as: LIPITOR TAKE 1/2 TABLET BY MOUTH EVERY DAY    feeding supplement Liqd Take 237 mLs by mouth 3 (three) times daily between meals.    folic acid 1 MG tablet Commonly known as: FOLVITE Take 1 tablet (1 mg total) by mouth daily. Start taking on: November 02, 2020    multivitamin with minerals Tabs tablet Take 1 tablet by mouth daily. Start taking on: November 02, 2020    polyethylene glycol 17 g packet Commonly known as: MIRALAX / GLYCOLAX Take 17 g by mouth daily. Start taking on: November 02, 2020    thiamine 100 MG tablet Take 1 tablet (100 mg total) by mouth daily. Start taking on: November 02, 2020                        Durable Medical Equipment  (From admission, onward)                 Start     Ordered    10/28/20 1304   For home use only DME lightweight manual wheelchair with seat cushion  Once       Comments: Patient suffers from Parkinson's and Covid which impairs their ability to perform daily activities like bathing, dressing, grooming, and toileting in the home.  A cane, crutch, or  walker will not resolve  issue with performing activities of daily living. A wheelchair will allow patient to safely perform daily activities. Patient is not able to propel themselves in the home using a standard weight wheelchair due to endurance and general weakness. Patient can self propel in the lightweight wheelchair. Length of need 6 months . Accessories: elevating leg rests (ELRs), wheel locks, extensions and anti-tippers.   10/28/20 1305                  DISPOSITION AND FOLLOW-UP:  Marvin Ingram was discharged from [facility] in stable condition. At the hospital follow up visit please address:   Follow-up Recommendations: Consults: none  Labs: none  Studies: none  Medications: none    Follow-up Appointments:   Follow-up Information       [facility] Follow up.   Specialty: [place] Why: the office will call within in 48 hours of discharge to schedule home health visits Contact information: [address] [place] [id number]              [name], MD. Schedule an appointment as soon as possible for a visit in 1 week(s).   Specialty: Family Medicine Contact information: [address] [place] [address] [phone]                          HOSPITAL COURSE:  Patient Summary: Marvin Ingram is a 74 y.o. with pertinent PMH of Parkinson's dementia, CAD s/p NSTEMI with DES in 2007, HLD, basal cell carcinoma who presented with sore throat, cough, generalized weakness and fever and admitted for COVID-19 infection.   COVID 19 Infection Remained asymptomatic and afebrile throughout hospitalization. O2 sat 99-100 on room air. No concerning physical examination findings. Managed conservatively with tussionex and robitussin. Encouraged ICS and flutter valve. Completed 5 day course of Paxlovid. Continuing to make progress with PT/OT.    Parkinson's dementia Physical deconditioning L  Hip pain  Pt with Parkinson's dementia with gait ataxia, presenting with overall generalized weakness and focal left hip pain controlled well with tylenol; no hx of trauma or recent falls. Strength remains RLE greater than LLE, but overall benign exam findings. Hip XR negative for hip fracture or dislocation. MRI shows no acute bony abnormality, mild left hip osteoarthritis with degenerative anterior superior labral tearing, and intramuscular edema within the left adductor muscles and left iliopsoas muscle, likely a muscle strain, but viral myositis is a possibility given COVID infection. There is mild left-sided subgluteus minimus bursitis. Will continue treating conservatively with analgesics and PT.  - Added sinemet 25-'100mg'$  BID per PCP [name] on PCP - Voltaren and tylenol q6h for inflammatory pain - PT/OT eval, recommending SNF placement. Continues to make progress with PT/OT.   Hx of CAD s/p NSTEMI w/DES  HLD - Continued aspirin '81mg'$  daily - Lipitor once paxlovid therapy is completed    DISCHARGE INSTRUCTIONS:    Ms. Lukehart, you were admitted for COVID infection and left hip pain. You remained asymptomatic and breathing was normal during the hospitalization. You also has hip pain which we working up with imaging, showing that it was likely a muscle strain. You are recommended to continue working with PT, analgesics as needed, and rest to help improve this. Please schedule an appointment with your PCP after discharge to better manage your chronic conditions, and to make any necessary adjustments to your medications, if applicable.   SUBJECTIVE:  No acute overnight events. Patient was seen at bedside during rounds this morning. He is reclining comfortably and looks like he is making progress daily. Pt reports feeling well this morning. Pt complains of mild hip pain as he has been working with PT. Pt denies SOB, CP, fevers, cough, fatigue. No other complains or concerns at this time.     Discharge Vitals:   BP 99/62 (BP Location: Right Arm)   Pulse 75   Temp 97.6 F (36.4 C) (Axillary)   Resp 16   Ht 5' 9.5" (1.765 m)   Wt 58.2 kg   SpO2 98%   BMI 18.68 kg/m    OBJECTIVE:  Constitutional: pleasant elderly male, no acute distress, remains interactive  Cardiovascular: Normal rate, regular rhythm, S1 and S2 present, no murmurs, rubs, gallops. Distal pulses intact.  Respiratory: No respiratory distress, no accessory muscle use. Lungs are clear to auscultation bilaterally. Musculoskeletal: Normal bulk; cogwheel rigidity noted in bilateral upper extremities. No peripheral  edema noted. Mild tenderness to palpation over posterior aspect of left hip and greater trochanter.  Skin: Warm and dry. Seborrheic dermatitis on scalp. No rash or erythema noted.    Pertinent Labs, Studies, and Procedures:  CBC Latest Ref Rng & Units 10/27/2020 10/26/2020 10/25/2020  WBC 4.0 - 10.5 K/uL 3.2(L) 3.2(L) 4.9  Hemoglobin 13.0 - 17.0 g/dL 12.2(L) 13.2 13.0  Hematocrit 39.0 - 52.0 % 34.9(L) 37.0(L) 36.7(L)  Platelets 150 - 400 K/uL 164 150 139(L)      CMP Latest Ref Rng & Units 10/27/2020 10/26/2020 10/25/2020  Glucose 70 - 99 mg/dL 102(H) 98 100(H)  BUN 8 - 23 mg/dL '11 11 12  '$ Creatinine 0.61 - 1.24 mg/dL 0.82 0.98 1.06  Sodium 135 - 145 mmol/L 136 136 137  Potassium 3.5 - 5.1 mmol/L 3.7 3.8 3.2(L)  Chloride 98 - 111 mmol/L 102 99 99  CO2 22 - 32 mmol/L '27 30 28  '$ Calcium 8.9 - 10.3 mg/dL 8.2(L) 8.4(L) 8.5(L)  Total Protein 6.5 - 8.1 g/dL - - -  Total Bilirubin 0.3 - 1.2 mg/dL - - -  Alkaline Phos 38 - 126 U/L - - -  AST 15 - 41 U/L - - -  ALT 0 - 44 U/L - - -      DG Chest Port 1 View   Result Date: 10/23/2020 CLINICAL DATA:  Questionable sepsis EXAM: PORTABLE CHEST 1 VIEW COMPARISON:  06/11/2005 FINDINGS: Normal heart size and stable mediastinal contours. Coronary stenting. There is no edema, consolidation, effusion, or pneumothorax. IMPRESSION: No acute finding.  No focal pneumonia.  Electronically Signed   By: Monte Fantasia M.D.   On: 10/23/2020 05:46    DG HIP UNILAT WITH PELVIS 2-3 VIEWS LEFT   Result Date: 10/23/2020 CLINICAL DATA:  Left hip pain EXAM: DG HIP (WITH OR WITHOUT PELVIS) 2-3V LEFT COMPARISON:  05/06/2017 FINDINGS: There is no evidence of hip fracture or dislocation. Mild bilateral hip joint space narrowing there is no evidence of other focal bone abnormality. IMPRESSION: Negative. Electronically Signed   By: Davina Poke D.O.   On: 10/23/2020 12:15      Signed: Lajean Manes, MD Internal Medicine Resident, PGY-1 [facility]  Pager: [phone] 3:09 PM, 11/01/2020

## 2020-11-01 NOTE — TOC Progression Note (Addendum)
Transition of Care [facility]) - Progression Note    Patient Details  Name: Marvin Ingram MRN: [id number] Date of Birth: 09-Dec-1945  Transition of Care [facility]) CM/SW [place], LCSW Phone Number: 11/01/2020, 9:24 AM  Clinical Narrative:    9:24am-[facility] checking to see if they can accept patient tomorrow. CSW spoke with patient's spouse to provide an update. She is now stating that she has set a meeting with [place] today at 2pm because she misses patient and feels like maybe he should come home instead. She then asked if patient could go to the hospice unit at the hospital. CSW explained there is no hospice unit at the hospital and that there was no medical reason for the patient to remain in the hospital. She stated understanding and said he could do therapies. CSW again explained that if patient were to do hospice at home, he would not be able to also receive therapy. She then stated that they could do home health with palliative instead if they do not choose to go to [facility]. CSW explained that CSW will start insurance today to make sure SNF would be covered so we will have that information for her decision by tomorrow. Info submitted to [employer] for [facility] and [facility] authorization.   CSW also spoke with patient's daughter-in-law Anderson Malta, who is in the room with patient. She expressed concern that [name] is requesting hospice as she was not aware that patient was not doing well. CSW explained that CSW sent a message to MD to clarify if patient would be eligible for home hospice. CSW provided supportive listening.    1pm-CSW spoke with patient's son, Zenaida Deed [phone]). They are about to meet with the [facility] liaison. CSW explained that paperwork for [facility] needs to be completed today so that patient can admit there tomorrow pending insurance approval. Nate reported understanding.  CSW then spoke with [facility] who had just spoken with the patient's  other son and they are planning on going to the facility at 3:30. [facility] requested discharge summary today for tomorrow; MD aware.   3:42pm-CSW sent dc summary to [facility]. Family is there currently signing paperwork. CSW waiting on insurance determination.   Expected Discharge Plan: [place] Barriers to Discharge: Continued Medical Work up  Expected Discharge Plan and Services Expected Discharge Plan: [place] In-house Referral: Clinical Social Work Discharge Planning Services: CM Consult Post Acute Care Choice: Home Health, Durable Medical Equipment Living arrangements for the past 2 months: Single Family Home                 DME Arranged: Wheelchair manual DME Agency: [employer] Date DME Agency Contacted: 10/28/20 Time DME Agency Contacted: [phone] Representative spoke with at DME Agency: Freda Munro HH Arranged: PT, OT, Nurse's Aide, Social Work [employer] Agency: Well [place] Date [place]: 10/28/20 Time [place]: [place] Representative spoke with at [place]: [place] ([place]) Interventions    Readmission Risk Interventions No flowsheet data found.

## 2020-11-01 NOTE — Progress Notes (Signed)
This patient's plan of care was discussed with the house staff. Please see their note for complete details. I concur with their findings.  We spoke at length with pt's daughter in law- There appears to be discordance between pt's children and pt's wife.  We are planning for pt to be d/c to [facility] tomorrow, hospice and palliative care issues can be addressed there.  His COVID isolation ends tomorrow.  He has significant wt loss over last 6 months.  His parkinson's medication can be addressed at SNF as well.

## 2020-11-01 NOTE — Progress Notes (Signed)
Physical Therapy Treatment Patient Details Name: Marvin Ingram MRN: [id number] DOB: 09/20/1945 Today's Date: 11/01/2020    History of Present Illness Marvin Ingram is a 75 y.o. male with a pertinent PMH of Parkinson's dementia and CAD s/p NSTEMI in 2007 with DES to LAD, hyperlipidemia, basal cell carcinoma, arthritis s/p R knee replacement, and bradycardia who presents to [facility] with one week of productive cough, sore throat, congestion and generalized weakness.    PT Comments    Pt received in bed, sleepy but easy to wake. Agreeable to OOB and participation in therapy. He required min assist bed mobility, min assist sit to stand, and min assist ambulation 35' with RW. Pt in recliner with feet elevated at end of session.    Follow Up Recommendations  SNF     Equipment Recommendations  Rolling walker with 5" wheels    Recommendations for Other Services       Precautions / Restrictions Precautions Precautions: Fall;Other (comment) Precaution Comments: coivd+    Mobility  Bed Mobility Overal bed mobility: Needs Assistance Bed Mobility: Supine to Sit     Supine to sit: Min assist;HOB elevated     General bed mobility comments: increased time, cues for sequencing    Transfers Overall transfer level: Needs assistance Equipment used: Rolling walker (2 wheeled) Transfers: Sit to/from Stand Sit to Stand: Min assist         General transfer comment: assist to power up and stabilize balance  Ambulation/Gait Ambulation/Gait assistance: Min assist Gait Distance (Feet): 35 Feet Assistive device: Rolling walker (2 wheeled) Gait Pattern/deviations: Step-through pattern;Decreased stride length;Shuffle;Narrow base of support;Trunk flexed Gait velocity: very slow Gait velocity interpretation: <1.8 ft/sec, indicate of risk for recurrent falls General Gait Details: improved step length and gait quality when therapist assists with RW management/speed   Stairs              Wheelchair Mobility    Modified Rankin (Stroke Patients Only)       Balance Overall balance assessment: Needs assistance Sitting-balance support: Feet supported;No upper extremity supported Sitting balance-Leahy Scale: Fair     Standing balance support: Bilateral upper extremity supported;During functional activity Standing balance-Leahy Scale: Poor Standing balance comment: reliant on external support                            Cognition Arousal/Alertness: Awake/alert Behavior During Therapy: Flat affect Overall Cognitive Status: History of cognitive impairments - at baseline                                 General Comments: slow processing at baseline      Exercises      General Comments        Pertinent Vitals/Pain Pain Assessment: Faces Faces Pain Scale: No hurt    Home Living                      Prior Function            PT Goals (current goals can now be found in the care plan section) Acute Rehab PT Goals Patient Stated Goal: not stated Progress towards PT goals: Progressing toward goals    Frequency    Min 2X/week      PT Plan Current plan remains appropriate    Co-evaluation  AM-PAC PT "6 Clicks" Mobility   Outcome Measure  Help needed turning from your back to your side while in a flat bed without using bedrails?: A Little Help needed moving from lying on your back to sitting on the side of a flat bed without using bedrails?: A Little Help needed moving to and from a bed to a chair (including a wheelchair)?: A Little Help needed standing up from a chair using your arms (e.g., wheelchair or bedside chair)?: A Little Help needed to walk in hospital room?: A Little Help needed climbing 3-5 steps with a railing? : Total 6 Click Score: 16    End of Session Equipment Utilized During Treatment: Gait belt Activity Tolerance: Patient tolerated treatment well Patient left: in  chair;with call bell/phone within reach;with chair alarm set Nurse Communication: Mobility status PT Visit Diagnosis: Other abnormalities of gait and mobility (R26.89);Muscle weakness (generalized) (M62.81);Other symptoms and signs involving the nervous system [id number])     Time: [id number] PT Time Calculation (min) (ACUTE ONLY): 17 min  Charges:  $Gait Training: 8-22 mins                     Lorrin Goodell, PT  Office # [phone] Pager [phone]    Lorriane Shire 11/01/2020, 11:05 AM

## 2020-11-01 NOTE — Discharge Instructions (Signed)
Marvin Ingram, you were admitted for COVID infection and left hip pain. You remained asymptomatic and breathing was normal during the hospitalization. You also has hip pain which we working up with imaging, showing that it was likely a muscle strain. You are recommended to continue working with PT, analgesics as needed, and rest to help improve this. Please schedule an appointment with your PCP after discharge to better manage your chronic conditions, and to make any necessary adjustments to your medications, if applicable, such as starting your Parkinson's medications.

## 2020-11-01 NOTE — Progress Notes (Signed)
Manufacturing engineer [facility]) Hospital Liaison note:  Met with Mrs. Gassman, her sons and daughter-in-law at 2:15pm today. Educated the family on [facility] Palliative and Hospice services. Family chose Palliative services and reported that the patient will go to [facility] for approximately 20 days for rehab and then discharge back home. Explained that we will call the Primary Care Provider to get an order, after receiving the order we call Mrs Vacca to schedule an appointment and then come to the home at the scheduled time. Family voiced understanding. Addressed all questions and concerns at this time. Gave Mrs Babler [facility] Palliative and Hospice flyers to give additional information, if needed. Family expressed appreciation for visit and understanding of all things discussed.

## 2020-11-01 NOTE — H&P (Deleted)
Name: Marvin Ingram MRN: [id number] DOB: 02-10-1946 75 y.o. PCP: [name], MD  Date of Admission: 10/23/2020  4:45 AM Date of Discharge:  11/02/20 Attending Physician: Dr. Daryll Drown   DISCHARGE DIAGNOSIS:  Primary Problem: [place] Hospital Problems: Active Problems:   COVID-19 virus infection   Hip pain    DISCHARGE MEDICATIONS:   Allergies as of 11/01/2020       Reactions   Amoxicillin Diarrhea   Doxycycline Diarrhea   Gatifloxacin Diarrhea   Prednisone         Medication List     STOP taking these medications    ibuprofen 200 MG tablet Commonly known as: ADVIL       TAKE these medications    acetaminophen 500 MG tablet Commonly known as: TYLENOL Take 500 mg by mouth every 6 (six) hours as needed (for pain/headache).   aspirin EC 81 MG tablet Take 162 mg by mouth daily.   atorvastatin 10 MG tablet Commonly known as: LIPITOR TAKE 1/2 TABLET BY MOUTH EVERY DAY   feeding supplement Liqd Take 237 mLs by mouth 3 (three) times daily between meals.   folic acid 1 MG tablet Commonly known as: FOLVITE Take 1 tablet (1 mg total) by mouth daily. Start taking on: November 02, 2020   multivitamin with minerals Tabs tablet Take 1 tablet by mouth daily. Start taking on: November 02, 2020   polyethylene glycol 17 g packet Commonly known as: MIRALAX / GLYCOLAX Take 17 g by mouth daily. Start taking on: November 02, 2020   thiamine 100 MG tablet Take 1 tablet (100 mg total) by mouth daily. Start taking on: November 02, 2020               Durable Medical Equipment  (From admission, onward)           Start     Ordered   10/28/20 1304  For home use only DME lightweight manual wheelchair with seat cushion  Once       Comments: Patient suffers from Parkinson's and Covid which impairs their ability to perform daily activities like bathing, dressing, grooming, and toileting in the home.  A cane, crutch, or walker will not resolve  issue with performing  activities of daily living. A wheelchair will allow patient to safely perform daily activities. Patient is not able to propel themselves in the home using a standard weight wheelchair due to endurance and general weakness. Patient can self propel in the lightweight wheelchair. Length of need 6 months . Accessories: elevating leg rests (ELRs), wheel locks, extensions and anti-tippers.   10/28/20 1305            DISPOSITION AND FOLLOW-UP:  [name] was discharged from [facility] in stable condition. At the hospital follow up visit please address:  Follow-up Recommendations: Consults: none  Labs: none  Studies: none  Medications: none   Follow-up Appointments:  Follow-up Information     [facility] Follow up.   Specialty: Home Health Services Why: the office will call within in 48 hours of discharge to schedule home health visits Contact information: [address] [place] [address] [phone]         [name], MD. Schedule an appointment as soon as possible for a visit in 1 week(s).   Specialty: Family Medicine Contact information: [address] [place] [address] [phone]  HOSPITAL COURSE:  Patient Summary: Marvin Ingram is a 75 y.o. with pertinent PMH of Parkinson's dementia, CAD s/p NSTEMI with DES in 2007, HLD, basal cell carcinoma who presented with sore throat, cough, generalized weakness and fever and admitted for COVID-19 infection.   COVID 19 Infection Remained asymptomatic and afebrile throughout hospitalization. O2 sat 99-100 on room air. No concerning physical examination findings. Managed conservatively with tussionex and robitussin. Encouraged ICS and flutter valve. Completed 5 day course of Paxlovid. Continuing to make progress with PT/OT.    Parkinson's dementia Physical deconditioning L Hip pain  Pt with Parkinson's dementia with gait ataxia, presenting with  overall generalized weakness and focal left hip pain controlled well with tylenol; no hx of trauma or recent falls. Strength remains RLE greater than LLE, but overall benign exam findings. Hip XR negative for hip fracture or dislocation. MRI shows no acute bony abnormality, mild left hip osteoarthritis with degenerative anterior superior labral tearing, and intramuscular edema within the left adductor muscles and left iliopsoas muscle, likely a muscle strain, but viral myositis is a possibility given COVID infection. There is mild left-sided subgluteus minimus bursitis. Will continue treating conservatively with analgesics and PT.  - Voltaren and tylenol q6h for inflammatory pain - PT/OT eval, recommending SNF placement. Continues to make progress with PT/OT.    Hx of CAD s/p NSTEMI w/DES  HLD - Continued aspirin '81mg'$  daily - Lipitor once paxlovid therapy is completed   DISCHARGE INSTRUCTIONS:   Marvin Ingram, you were admitted for COVID infection and left hip pain. You remained asymptomatic and breathing was normal during the hospitalization. You also has hip pain which we working up with imaging, showing that it was likely a muscle strain. You are recommended to continue working with PT, analgesics as needed, and rest to help improve this. Please schedule an appointment with your PCP after discharge to better manage your chronic conditions, and to make any necessary adjustments to your medications, if applicable.  SUBJECTIVE:  No acute overnight events. Patient was seen at bedside during rounds this morning. He is reclining comfortably and looks like he is making progress daily. Pt reports feeling well this morning. Pt complains of mild hip pain as he has been working with PT. Pt denies SOB, CP, fevers, cough, fatigue. No other complains or concerns at this time.   Discharge Vitals:   BP 99/62 (BP Location: Right Arm)   Pulse 75   Temp 97.6 F (36.4 C) (Axillary)   Resp 16   Ht 5' 9.5" (1.765 m)    Wt 58.2 kg   SpO2 98%   BMI 18.68 kg/m   OBJECTIVE:  Constitutional: pleasant elderly male, no acute distress, remains interactive  Cardiovascular: Normal rate, regular rhythm, S1 and S2 present, no murmurs, rubs, gallops. Distal pulses intact.  Respiratory: No respiratory distress, no accessory muscle use. Lungs are clear to auscultation bilaterally. Musculoskeletal: Normal bulk; cogwheel rigidity noted in bilateral upper extremities. No peripheral edema noted. Mild tenderness to palpation over posterior aspect of left hip and greater trochanter.  Skin: Warm and dry. Seborrheic dermatitis on scalp. No rash or erythema noted.   Pertinent Labs, Studies, and Procedures:  CBC Latest Ref Rng & Units 10/27/2020 10/26/2020 10/25/2020  WBC 4.0 - 10.5 K/uL 3.2(L) 3.2(L) 4.9  Hemoglobin 13.0 - 17.0 g/dL 12.2(L) 13.2 13.0  Hematocrit 39.0 - 52.0 % 34.9(L) 37.0(L) 36.7(L)  Platelets 150 - 400 K/uL 164 150 139(L)    CMP Latest Ref Rng & Units 10/27/2020 10/26/2020  10/25/2020  Glucose 70 - 99 mg/dL 102(H) 98 100(H)  BUN 8 - 23 mg/dL '11 11 12  '$ Creatinine 0.61 - 1.24 mg/dL 0.82 0.98 1.06  Sodium 135 - 145 mmol/L 136 136 137  Potassium 3.5 - 5.1 mmol/L 3.7 3.8 3.2(L)  Chloride 98 - 111 mmol/L 102 99 99  CO2 22 - 32 mmol/L '27 30 28  '$ Calcium 8.9 - 10.3 mg/dL 8.2(L) 8.4(L) 8.5(L)  Total Protein 6.5 - 8.1 g/dL - - -  Total Bilirubin 0.3 - 1.2 mg/dL - - -  Alkaline Phos 38 - 126 U/L - - -  AST 15 - 41 U/L - - -  ALT 0 - 44 U/L - - -    DG Chest Port 1 View  Result Date: 10/23/2020 CLINICAL DATA:  Questionable sepsis EXAM: PORTABLE CHEST 1 VIEW COMPARISON:  06/11/2005 FINDINGS: Normal heart size and stable mediastinal contours. Coronary stenting. There is no edema, consolidation, effusion, or pneumothorax. IMPRESSION: No acute finding.  No focal pneumonia. Electronically Signed   By: Monte Fantasia M.D.   On: 10/23/2020 05:46   DG HIP UNILAT WITH PELVIS 2-3 VIEWS LEFT  Result Date:  10/23/2020 CLINICAL DATA:  Left hip pain EXAM: DG HIP (WITH OR WITHOUT PELVIS) 2-3V LEFT COMPARISON:  05/06/2017 FINDINGS: There is no evidence of hip fracture or dislocation. Mild bilateral hip joint space narrowing there is no evidence of other focal bone abnormality. IMPRESSION: Negative. Electronically Signed   By: Davina Poke D.O.   On: 10/23/2020 12:15     Signed: Lajean Manes, MD Internal Medicine Resident, PGY-1 [facility]  Pager: [phone] 3:09 PM, 11/01/2020

## 2020-11-02 DIAGNOSIS — M25559 Pain in unspecified hip: Secondary | ICD-10-CM | POA: Diagnosis not present

## 2020-11-02 DIAGNOSIS — U071 COVID-19: Secondary | ICD-10-CM | POA: Diagnosis not present

## 2020-11-02 NOTE — TOC Transition Note (Addendum)
Transition of Care [facility]) - CM/SW Discharge Note   Patient Details  Name: Marvin Ingram MRN: [id number] Date of Birth: [birth year]  Transition of Care [facility]) CM/SW Contact:  Gabrielle Dare Phone Number: 11/02/2020, 3:20 PM   Clinical Narrative:    Patient will Discharge To:[facility] Anticipated DC Date:11/03/20 Family Notified:yes, daughter Kirubel Nunn, [phone] Transport [id number] (Auth # [id number]) Note: pt did not leave on 11/02/20 due to late arrival at facility  Per MD patient ready for DC to [facility] . RN, patient, patient's family, and facility notified of DC. Assessment, Fl2/Pasrr, and Discharge Summary sent to facility. RN given number for report [phone], Room # [phone]). DC packet on chart. Ambulance transport requested for patient.   CSW signing off.  Reed Breech [facility] [phone]     Final next level of care: Skilled Nursing Facility Barriers to Discharge: No Barriers Identified   Patient Goals and CMS Choice Patient states their goals for this hospitalization and ongoing recovery are:: return home with family support and home health CMS Medicare.gov Compare Post Acute Care list provided to:: Patient Represenative (must comment) Gifford Shave (wife)) Choice offered to / list presented to : Spouse  Discharge Placement              Patient [facility] bed at: [facility] Patient to be transferred to facility by: [place] Name of family member notified: Channing Mutters Patient and family notified of of transfer: 11/02/20  Discharge Plan and Services In-house Referral: Clinical Social Work Discharge Planning Services: CM Consult Post Acute Care Choice: Home Health, Durable Medical Equipment          DME Arranged: Wheelchair manual DME Agency: [employer] Date DME Agency Contacted: 10/28/20 Time DME Agency Contacted: [phone] Representative spoke with at DME Agency: Freda Munro [place]: PT, OT, Nurse's Aide, Social Work [employer] Agency: Well  [place] Date [place]: 10/28/20 Time [place]: [place] Representative spoke with at [place]: [place] ([place]) Interventions     Readmission Risk Interventions No flowsheet data found.

## 2020-11-02 NOTE — TOC Progression Note (Addendum)
Transition of Care [facility]) - Progression Note    Patient Details  Name: Marvin Ingram MRN: [id number] Date of Birth: 01/17/1946  Transition of Care [facility]) CM/SW [place], [place] Phone Number: 11/02/2020, 9:57 AM  Clinical Narrative:    CSW contacted [employer] and spoke with [facility].  Pt's insurance is pending.  [place] will contact CSW when it has been approved for SNF palcement at [facility].  TOC will continue to assist with disposition planning. 12:33pm: Insurance auth & transportation is still pending for pt.  CSW spoke with pt's daughter concerning insurance auth.  CSW will update pt's daughter and [facility] with insurance information as received.   Expected Discharge Plan: [place] Barriers to Discharge: Continued Medical Work up  Expected Discharge Plan and Services Expected Discharge Plan: [place] In-house Referral: Clinical Social Work Discharge Planning Services: CM Consult Post Acute Care Choice: Home Health, Durable Medical Equipment Living arrangements for the past 2 months: [place] Expected Discharge Date: 11/02/20               DME Arranged: Wheelchair manual DME Agency: [employer] Date DME Agency Contacted: 10/28/20 Time DME Agency Contacted: [phone] Representative spoke with at DME Agency: Freda Munro HH Arranged: PT, OT, Nurse's Aide, Social Work [employer] Agency: Well [place] Date [place]: 10/28/20 Time [place]: [place] Representative spoke with at [place]: [place] ([place]) Interventions    Readmission Risk Interventions No flowsheet data found.

## 2020-11-02 NOTE — TOC Progression Note (Addendum)
Transition of Care [facility]) - Progression Note    Patient Details  Name: Marvin Ingram MRN: [id number] Date of Birth: 07-30-1945  Transition of Care [facility]) CM/SW [place], [place] Phone Number: 11/02/2020, 3:06 PM  Clinical Narrative:    Pt has received insurance auth # [id number] for SNF and Auth # [id number] transportation .  CSW spoke with Nicole Kindred from [employer].  Pt's bed will not be available until Sunday between 10:30am and 11:00am. CSW will update pt's daughter.  TOC will continue to assist with disposition planning. 3:09pm: CSW received call from [place] at [employer] . Pt can go to facility today.  CSW will facilitate disposition to [facility].  Expected Discharge Plan: [place] Barriers to Discharge: Continued Medical Work up  Expected Discharge Plan and Services Expected Discharge Plan: [place] In-house Referral: Clinical Social Work Discharge Planning Services: CM Consult Post Acute Care Choice: Home Health, Durable Medical Equipment Living arrangements for the past 2 months: [place] Expected Discharge Date: 11/02/20               DME Arranged: Wheelchair manual DME Agency: [employer] Date DME Agency Contacted: 10/28/20 Time DME Agency Contacted: [phone] Representative spoke with at DME Agency: Freda Munro HH Arranged: PT, OT, Nurse's Aide, Social Work [employer] Agency: Well [place] Date [place]: 10/28/20 Time [place]: [place] Representative spoke with at [place]: [place] ([place]) Interventions    Readmission Risk Interventions No flowsheet data found.

## 2020-11-03 DIAGNOSIS — M25552 Pain in left hip: Secondary | ICD-10-CM | POA: Diagnosis not present

## 2020-11-03 DIAGNOSIS — F0390 Unspecified dementia without behavioral disturbance: Secondary | ICD-10-CM | POA: Diagnosis not present

## 2020-11-03 DIAGNOSIS — R2681 Unsteadiness on feet: Secondary | ICD-10-CM | POA: Diagnosis not present

## 2020-11-03 DIAGNOSIS — E43 Unspecified severe protein-calorie malnutrition: Secondary | ICD-10-CM | POA: Diagnosis not present

## 2020-11-03 DIAGNOSIS — K5900 Constipation, unspecified: Secondary | ICD-10-CM | POA: Diagnosis not present

## 2020-11-03 DIAGNOSIS — R41841 Cognitive communication deficit: Secondary | ICD-10-CM | POA: Diagnosis not present

## 2020-11-03 DIAGNOSIS — L219 Seborrheic dermatitis, unspecified: Secondary | ICD-10-CM | POA: Diagnosis not present

## 2020-11-03 DIAGNOSIS — R531 Weakness: Secondary | ICD-10-CM | POA: Diagnosis not present

## 2020-11-03 DIAGNOSIS — I2510 Atherosclerotic heart disease of native coronary artery without angina pectoris: Secondary | ICD-10-CM | POA: Diagnosis not present

## 2020-11-03 DIAGNOSIS — M6281 Muscle weakness (generalized): Secondary | ICD-10-CM | POA: Diagnosis not present

## 2020-11-03 DIAGNOSIS — R278 Other lack of coordination: Secondary | ICD-10-CM | POA: Diagnosis not present

## 2020-11-03 DIAGNOSIS — U071 COVID-19: Secondary | ICD-10-CM | POA: Diagnosis not present

## 2020-11-03 DIAGNOSIS — E785 Hyperlipidemia, unspecified: Secondary | ICD-10-CM | POA: Diagnosis not present

## 2020-11-03 DIAGNOSIS — G20 Parkinson's disease: Secondary | ICD-10-CM | POA: Diagnosis not present

## 2020-11-03 DIAGNOSIS — I25810 Atherosclerosis of coronary artery bypass graft(s) without angina pectoris: Secondary | ICD-10-CM | POA: Diagnosis not present

## 2020-11-03 DIAGNOSIS — R269 Unspecified abnormalities of gait and mobility: Secondary | ICD-10-CM | POA: Diagnosis not present

## 2020-11-03 NOTE — Progress Notes (Signed)
Transport currently on the unit to transfer patient to facility. I was informed by transport that the facility only accepts patients between 8am and 8pm per their policy.  I called [facility] without any success due to no one answering the phone.  Family notified at bedside.

## 2020-11-04 DIAGNOSIS — M25552 Pain in left hip: Secondary | ICD-10-CM | POA: Diagnosis not present

## 2020-11-04 DIAGNOSIS — R531 Weakness: Secondary | ICD-10-CM | POA: Diagnosis not present

## 2020-11-04 DIAGNOSIS — L219 Seborrheic dermatitis, unspecified: Secondary | ICD-10-CM | POA: Diagnosis not present

## 2020-11-04 DIAGNOSIS — U071 COVID-19: Secondary | ICD-10-CM | POA: Diagnosis not present

## 2020-11-04 DIAGNOSIS — F0390 Unspecified dementia without behavioral disturbance: Secondary | ICD-10-CM | POA: Diagnosis not present

## 2020-11-04 DIAGNOSIS — K5900 Constipation, unspecified: Secondary | ICD-10-CM | POA: Diagnosis not present

## 2020-11-04 DIAGNOSIS — I2510 Atherosclerotic heart disease of native coronary artery without angina pectoris: Secondary | ICD-10-CM | POA: Diagnosis not present

## 2020-11-04 DIAGNOSIS — E785 Hyperlipidemia, unspecified: Secondary | ICD-10-CM | POA: Diagnosis not present

## 2020-11-04 DIAGNOSIS — G20 Parkinson's disease: Secondary | ICD-10-CM | POA: Diagnosis not present

## 2020-11-08 DIAGNOSIS — F0390 Unspecified dementia without behavioral disturbance: Secondary | ICD-10-CM | POA: Diagnosis not present

## 2020-11-08 DIAGNOSIS — G20 Parkinson's disease: Secondary | ICD-10-CM | POA: Diagnosis not present

## 2020-11-08 DIAGNOSIS — M25552 Pain in left hip: Secondary | ICD-10-CM | POA: Diagnosis not present

## 2020-11-08 DIAGNOSIS — R269 Unspecified abnormalities of gait and mobility: Secondary | ICD-10-CM | POA: Diagnosis not present

## 2020-11-08 DIAGNOSIS — E43 Unspecified severe protein-calorie malnutrition: Secondary | ICD-10-CM | POA: Diagnosis not present

## 2020-11-08 DIAGNOSIS — R531 Weakness: Secondary | ICD-10-CM | POA: Diagnosis not present

## 2020-11-08 DIAGNOSIS — K5900 Constipation, unspecified: Secondary | ICD-10-CM | POA: Diagnosis not present

## 2020-11-12 DIAGNOSIS — F0390 Unspecified dementia without behavioral disturbance: Secondary | ICD-10-CM | POA: Diagnosis not present

## 2020-11-12 DIAGNOSIS — G20 Parkinson's disease: Secondary | ICD-10-CM | POA: Diagnosis not present

## 2020-11-12 DIAGNOSIS — R531 Weakness: Secondary | ICD-10-CM | POA: Diagnosis not present

## 2020-11-12 DIAGNOSIS — E785 Hyperlipidemia, unspecified: Secondary | ICD-10-CM | POA: Diagnosis not present

## 2020-11-14 DIAGNOSIS — G20 Parkinson's disease: Secondary | ICD-10-CM | POA: Diagnosis not present

## 2020-11-14 DIAGNOSIS — F0390 Unspecified dementia without behavioral disturbance: Secondary | ICD-10-CM | POA: Diagnosis not present

## 2020-11-14 DIAGNOSIS — R531 Weakness: Secondary | ICD-10-CM | POA: Diagnosis not present

## 2020-11-21 DIAGNOSIS — G20 Parkinson's disease: Secondary | ICD-10-CM | POA: Diagnosis not present

## 2020-11-21 DIAGNOSIS — F17210 Nicotine dependence, cigarettes, uncomplicated: Secondary | ICD-10-CM | POA: Diagnosis not present

## 2020-11-21 DIAGNOSIS — Z951 Presence of aortocoronary bypass graft: Secondary | ICD-10-CM | POA: Diagnosis not present

## 2020-11-21 DIAGNOSIS — M1990 Unspecified osteoarthritis, unspecified site: Secondary | ICD-10-CM | POA: Diagnosis not present

## 2020-11-21 DIAGNOSIS — I2510 Atherosclerotic heart disease of native coronary artery without angina pectoris: Secondary | ICD-10-CM | POA: Diagnosis not present

## 2020-11-21 DIAGNOSIS — E785 Hyperlipidemia, unspecified: Secondary | ICD-10-CM | POA: Diagnosis not present

## 2020-11-21 DIAGNOSIS — Z8616 Personal history of COVID-19: Secondary | ICD-10-CM | POA: Diagnosis not present

## 2020-11-21 DIAGNOSIS — F0280 Dementia in other diseases classified elsewhere without behavioral disturbance: Secondary | ICD-10-CM | POA: Diagnosis not present

## 2020-11-21 DIAGNOSIS — C4491 Basal cell carcinoma of skin, unspecified: Secondary | ICD-10-CM | POA: Diagnosis not present

## 2020-11-21 DIAGNOSIS — I252 Old myocardial infarction: Secondary | ICD-10-CM | POA: Diagnosis not present

## 2020-11-21 DIAGNOSIS — Z7982 Long term (current) use of aspirin: Secondary | ICD-10-CM | POA: Diagnosis not present

## 2020-11-21 DIAGNOSIS — Z96651 Presence of right artificial knee joint: Secondary | ICD-10-CM | POA: Diagnosis not present

## 2020-11-21 DIAGNOSIS — Z9181 History of falling: Secondary | ICD-10-CM | POA: Diagnosis not present

## 2020-11-28 DIAGNOSIS — M25559 Pain in unspecified hip: Secondary | ICD-10-CM | POA: Diagnosis not present

## 2020-11-28 DIAGNOSIS — U071 COVID-19: Secondary | ICD-10-CM | POA: Diagnosis not present

## 2020-12-23 ENCOUNTER — Telehealth: Payer: Self-pay

## 2020-12-23 NOTE — Telephone Encounter (Signed)
(  3:26 pm) Mrs. Dollins returned call to SW to schedule initial palliative care visit. RN/SW visit is scheduled for 01/03/28 @ 12:15 pm.

## 2020-12-23 NOTE — Telephone Encounter (Signed)
(  3:03 pm) SW attempted to call patient and his spouse to schedule initial palliative care visit. SW left a message requesting a call back to schedule.

## 2020-12-27 DIAGNOSIS — G20 Parkinson's disease: Secondary | ICD-10-CM | POA: Diagnosis not present

## 2020-12-27 DIAGNOSIS — R269 Unspecified abnormalities of gait and mobility: Secondary | ICD-10-CM | POA: Diagnosis not present

## 2020-12-27 DIAGNOSIS — Z23 Encounter for immunization: Secondary | ICD-10-CM | POA: Diagnosis not present

## 2020-12-27 DIAGNOSIS — Z8616 Personal history of COVID-19: Secondary | ICD-10-CM | POA: Diagnosis not present

## 2020-12-27 DIAGNOSIS — R32 Unspecified urinary incontinence: Secondary | ICD-10-CM | POA: Diagnosis not present

## 2020-12-27 DIAGNOSIS — Z09 Encounter for follow-up examination after completed treatment for conditions other than malignant neoplasm: Secondary | ICD-10-CM | POA: Diagnosis not present

## 2020-12-28 DIAGNOSIS — M25559 Pain in unspecified hip: Secondary | ICD-10-CM | POA: Diagnosis not present

## 2020-12-28 DIAGNOSIS — U071 COVID-19: Secondary | ICD-10-CM | POA: Diagnosis not present

## 2021-01-02 ENCOUNTER — Other Ambulatory Visit: Payer: Self-pay

## 2021-01-02 ENCOUNTER — Other Ambulatory Visit: Payer: PPO

## 2021-01-02 ENCOUNTER — Other Ambulatory Visit: Payer: PPO | Admitting: *Deleted

## 2021-01-02 DIAGNOSIS — Z515 Encounter for palliative care: Secondary | ICD-10-CM

## 2021-01-02 NOTE — Progress Notes (Signed)
COMMUNITY PALLIATIVE CARE SW NOTE  PATIENT NAME: Marvin Ingram DOB: 02-05-1946 MRN: [id number]  PRIMARY CARE PROVIDER: [name], MD  RESPONSIBLE PARTY:  Acct ID - Guarantor Home Phone Work Phone Relationship Acct Type  [id number] - Clodfelter,[phone]  Self P/F     [address], [place], [place] [address]     PLAN OF CARE and INTERVENTIONS:             GOALS OF CARE/ ADVANCE CARE PLANNING:  Goal is for patient  to remain in his home where is wife will care for him. Patient is a Full Code.  SOCIAL/EMOTIONAL/SPIRITUAL ASSESSMENT/ INTERVENTIONS:  SW and RN-M. Nadara Mustard completed an initial visit at his home. He was present with his wife and son-Blaine. The team provided education to patient and family regarding palliative care services. The team was provided an update on patient's medical status and social history. Patient has a Parkinson's diagnosis. Patient's wife advised that she has requested that patient's therapy restart through [facility]. Patient has had issues with constipation, but that has been resolved by taking Miralax daily. Patient's wife assist with dressing and bathing. Patient is weaker and requires rest following any activity. Patient ambulates with his walker and does try to go out for fresh air and exercise. Patient is continent, but had a 4 days where he had an incontinent episodes. He has not had any other issues since. He does wear adult briefs or pad inserts when appropriate. His appetite is good and he is eating three meals per day, but food has to cut up in smaller portions. Patient is able to feed herself. Patient has had some weight loss over the past year. His current weight is 144 lbs. Patient sleeps well at night, but will get up a few times a night for the bathroom. Patient is alert and oriented x3, but verbalizations are delayed and he speaks in a low tone and is difficulty to understand at times. SOCIAL HISTORY: Patient was born and raised in [place]. He  retired from Engineer, agricultural. He has been married for 51 years. He has three sons. He has a POA/HCPOA. Patient is a full code. Patient's wife advised that they wanted to discuss it further before deciding if they wanted to continue with services.  PATIENT/CAREGIVER EDUCATION/ COPING:  Patient and his wife appear to be coping well. They have good family support. PERSONAL EMERGENCY PLAN:  911 can be accessed for emergencies. COMMUNITY RESOURCES COORDINATION/ HEALTH CARE NAVIGATION:  [facility] to begin therapy. FINANCIAL/LEGAL CONCERNS/INTERVENTIONS:  None.     SOCIAL HX:  Social History   Tobacco Use   Smoking status: Former    Years: 2.00    Types: Cigarettes   Smokeless tobacco: Never   Tobacco comments:    smoked off and on during college  Substance Use Topics   Alcohol use: Not on file    Comment: occassionally    CODE STATUS: Full Code ADVANCED DIRECTIVES: Yes MOST FORM COMPLETE:  No HOSPICE EDUCATION PROVIDED: Yes, difference between palliative care and hospice.  PPS: Patient is alert and oriented x3. He requires assistance with personal care.   Duration of visit and documentation: 120 minutes.  [address] [place], 

## 2021-01-06 DIAGNOSIS — Z96651 Presence of right artificial knee joint: Secondary | ICD-10-CM | POA: Diagnosis not present

## 2021-01-06 DIAGNOSIS — E7800 Pure hypercholesterolemia, unspecified: Secondary | ICD-10-CM | POA: Diagnosis not present

## 2021-01-06 DIAGNOSIS — Z7982 Long term (current) use of aspirin: Secondary | ICD-10-CM | POA: Diagnosis not present

## 2021-01-06 DIAGNOSIS — R32 Unspecified urinary incontinence: Secondary | ICD-10-CM | POA: Diagnosis not present

## 2021-01-06 DIAGNOSIS — K5900 Constipation, unspecified: Secondary | ICD-10-CM | POA: Diagnosis not present

## 2021-01-06 DIAGNOSIS — Z9181 History of falling: Secondary | ICD-10-CM | POA: Diagnosis not present

## 2021-01-06 DIAGNOSIS — M109 Gout, unspecified: Secondary | ICD-10-CM | POA: Diagnosis not present

## 2021-01-06 DIAGNOSIS — L821 Other seborrheic keratosis: Secondary | ICD-10-CM | POA: Diagnosis not present

## 2021-01-06 DIAGNOSIS — Z8616 Personal history of COVID-19: Secondary | ICD-10-CM | POA: Diagnosis not present

## 2021-01-06 DIAGNOSIS — M1731 Unilateral post-traumatic osteoarthritis, right knee: Secondary | ICD-10-CM | POA: Diagnosis not present

## 2021-01-06 DIAGNOSIS — I252 Old myocardial infarction: Secondary | ICD-10-CM | POA: Diagnosis not present

## 2021-01-06 DIAGNOSIS — I2510 Atherosclerotic heart disease of native coronary artery without angina pectoris: Secondary | ICD-10-CM | POA: Diagnosis not present

## 2021-01-06 DIAGNOSIS — G20 Parkinson's disease: Secondary | ICD-10-CM | POA: Diagnosis not present

## 2021-01-06 DIAGNOSIS — Z85828 Personal history of other malignant neoplasm of skin: Secondary | ICD-10-CM | POA: Diagnosis not present

## 2021-01-10 DIAGNOSIS — M1731 Unilateral post-traumatic osteoarthritis, right knee: Secondary | ICD-10-CM | POA: Diagnosis not present

## 2021-01-10 DIAGNOSIS — E7800 Pure hypercholesterolemia, unspecified: Secondary | ICD-10-CM | POA: Diagnosis not present

## 2021-01-10 DIAGNOSIS — L821 Other seborrheic keratosis: Secondary | ICD-10-CM | POA: Diagnosis not present

## 2021-01-10 DIAGNOSIS — K5900 Constipation, unspecified: Secondary | ICD-10-CM | POA: Diagnosis not present

## 2021-01-10 DIAGNOSIS — I252 Old myocardial infarction: Secondary | ICD-10-CM | POA: Diagnosis not present

## 2021-01-10 DIAGNOSIS — R32 Unspecified urinary incontinence: Secondary | ICD-10-CM | POA: Diagnosis not present

## 2021-01-10 DIAGNOSIS — I2510 Atherosclerotic heart disease of native coronary artery without angina pectoris: Secondary | ICD-10-CM | POA: Diagnosis not present

## 2021-01-10 DIAGNOSIS — G20 Parkinson's disease: Secondary | ICD-10-CM | POA: Diagnosis not present

## 2021-01-10 DIAGNOSIS — Z9181 History of falling: Secondary | ICD-10-CM | POA: Diagnosis not present

## 2021-01-10 DIAGNOSIS — M109 Gout, unspecified: Secondary | ICD-10-CM | POA: Diagnosis not present

## 2021-01-10 DIAGNOSIS — Z8616 Personal history of COVID-19: Secondary | ICD-10-CM | POA: Diagnosis not present

## 2021-01-10 DIAGNOSIS — Z85828 Personal history of other malignant neoplasm of skin: Secondary | ICD-10-CM | POA: Diagnosis not present

## 2021-01-10 DIAGNOSIS — Z7982 Long term (current) use of aspirin: Secondary | ICD-10-CM | POA: Diagnosis not present

## 2021-01-10 DIAGNOSIS — Z96651 Presence of right artificial knee joint: Secondary | ICD-10-CM | POA: Diagnosis not present

## 2021-01-13 DIAGNOSIS — E7800 Pure hypercholesterolemia, unspecified: Secondary | ICD-10-CM | POA: Diagnosis not present

## 2021-01-13 DIAGNOSIS — I252 Old myocardial infarction: Secondary | ICD-10-CM | POA: Diagnosis not present

## 2021-01-13 DIAGNOSIS — K5900 Constipation, unspecified: Secondary | ICD-10-CM | POA: Diagnosis not present

## 2021-01-13 DIAGNOSIS — Z8616 Personal history of COVID-19: Secondary | ICD-10-CM | POA: Diagnosis not present

## 2021-01-13 DIAGNOSIS — I2510 Atherosclerotic heart disease of native coronary artery without angina pectoris: Secondary | ICD-10-CM | POA: Diagnosis not present

## 2021-01-13 DIAGNOSIS — Z85828 Personal history of other malignant neoplasm of skin: Secondary | ICD-10-CM | POA: Diagnosis not present

## 2021-01-13 DIAGNOSIS — G20 Parkinson's disease: Secondary | ICD-10-CM | POA: Diagnosis not present

## 2021-01-13 DIAGNOSIS — L821 Other seborrheic keratosis: Secondary | ICD-10-CM | POA: Diagnosis not present

## 2021-01-13 DIAGNOSIS — M109 Gout, unspecified: Secondary | ICD-10-CM | POA: Diagnosis not present

## 2021-01-13 DIAGNOSIS — Z9181 History of falling: Secondary | ICD-10-CM | POA: Diagnosis not present

## 2021-01-13 DIAGNOSIS — M1731 Unilateral post-traumatic osteoarthritis, right knee: Secondary | ICD-10-CM | POA: Diagnosis not present

## 2021-01-13 DIAGNOSIS — R32 Unspecified urinary incontinence: Secondary | ICD-10-CM | POA: Diagnosis not present

## 2021-01-13 DIAGNOSIS — Z96651 Presence of right artificial knee joint: Secondary | ICD-10-CM | POA: Diagnosis not present

## 2021-01-13 DIAGNOSIS — Z7982 Long term (current) use of aspirin: Secondary | ICD-10-CM | POA: Diagnosis not present

## 2021-01-16 DIAGNOSIS — Z8616 Personal history of COVID-19: Secondary | ICD-10-CM | POA: Diagnosis not present

## 2021-01-16 DIAGNOSIS — I2510 Atherosclerotic heart disease of native coronary artery without angina pectoris: Secondary | ICD-10-CM | POA: Diagnosis not present

## 2021-01-16 DIAGNOSIS — E7800 Pure hypercholesterolemia, unspecified: Secondary | ICD-10-CM | POA: Diagnosis not present

## 2021-01-16 DIAGNOSIS — L84 Corns and callosities: Secondary | ICD-10-CM | POA: Diagnosis not present

## 2021-01-16 DIAGNOSIS — L600 Ingrowing nail: Secondary | ICD-10-CM | POA: Diagnosis not present

## 2021-01-16 DIAGNOSIS — R269 Unspecified abnormalities of gait and mobility: Secondary | ICD-10-CM | POA: Diagnosis not present

## 2021-01-16 DIAGNOSIS — G20 Parkinson's disease: Secondary | ICD-10-CM | POA: Diagnosis not present

## 2021-01-16 DIAGNOSIS — I2583 Coronary atherosclerosis due to lipid rich plaque: Secondary | ICD-10-CM | POA: Diagnosis not present

## 2021-01-20 DIAGNOSIS — R350 Frequency of micturition: Secondary | ICD-10-CM | POA: Diagnosis not present

## 2021-01-21 DIAGNOSIS — Z8616 Personal history of COVID-19: Secondary | ICD-10-CM | POA: Diagnosis not present

## 2021-01-21 DIAGNOSIS — K5900 Constipation, unspecified: Secondary | ICD-10-CM | POA: Diagnosis not present

## 2021-01-21 DIAGNOSIS — M1731 Unilateral post-traumatic osteoarthritis, right knee: Secondary | ICD-10-CM | POA: Diagnosis not present

## 2021-01-21 DIAGNOSIS — Z7982 Long term (current) use of aspirin: Secondary | ICD-10-CM | POA: Diagnosis not present

## 2021-01-21 DIAGNOSIS — L821 Other seborrheic keratosis: Secondary | ICD-10-CM | POA: Diagnosis not present

## 2021-01-21 DIAGNOSIS — M109 Gout, unspecified: Secondary | ICD-10-CM | POA: Diagnosis not present

## 2021-01-21 DIAGNOSIS — I252 Old myocardial infarction: Secondary | ICD-10-CM | POA: Diagnosis not present

## 2021-01-21 DIAGNOSIS — Z9181 History of falling: Secondary | ICD-10-CM | POA: Diagnosis not present

## 2021-01-21 DIAGNOSIS — E7800 Pure hypercholesterolemia, unspecified: Secondary | ICD-10-CM | POA: Diagnosis not present

## 2021-01-21 DIAGNOSIS — G20 Parkinson's disease: Secondary | ICD-10-CM | POA: Diagnosis not present

## 2021-01-21 DIAGNOSIS — I2510 Atherosclerotic heart disease of native coronary artery without angina pectoris: Secondary | ICD-10-CM | POA: Diagnosis not present

## 2021-01-21 DIAGNOSIS — R32 Unspecified urinary incontinence: Secondary | ICD-10-CM | POA: Diagnosis not present

## 2021-01-21 DIAGNOSIS — Z85828 Personal history of other malignant neoplasm of skin: Secondary | ICD-10-CM | POA: Diagnosis not present

## 2021-01-21 DIAGNOSIS — Z96651 Presence of right artificial knee joint: Secondary | ICD-10-CM | POA: Diagnosis not present

## 2021-02-10 NOTE — Progress Notes (Signed)
[facility] COMMUNITY PALLIATIVE CARE RN NOTE  PATIENT NAME: Marvin Ingram DOB: 09/26/1945 MRN: [id number]  PRIMARY CARE PROVIDER: Wilson, Fred H, MD  RESPONSIBLE PARTY: [name] Stoltzfus (wife) Acct ID - Guarantor Home Phone Work Phone Relationship Acct Type  [id number] - Mcfarlane,JOSE* [phone]  Self P/F     [address], [place], [place] [address]   Covid-19 Pre-screening Negative  PLAN OF CARE and INTERVENTION:  ADVANCE CARE PLANNING/GOALS OF CARE: Goal is for patient to remain in the home with his wife. He is a Full code. PATIENT/CAREGIVER EDUCATION: Explained palliative care services, symptom management, safe mobility, s/s of infection DISEASE STATUS: Joint initial palliative care visit made with LCSW, M. Lonon. Met with patient, his wife [name] and son Blaine. Upon arrival, patient was standing at the door with his wife to greet us. Patient is ambulatory, but walks with a shuffle and 1 person assistance. He also uses a walker when outside. He has a diagnosis of Parkinson's disease. Patient is alert and oriented x 3 with a pleasant mood. He is communicative, but responses are delayed and he voice is very weak and difficult to hear at times. He has become progressively weaker. He requires assistance with bathing and dressing. Wife has reached back out to [facility] to restart therapy. Patient was also previously at [facility] facility for rehab and wife took him out early feeling that they could do this on their own but now realizes they need help. His appetite is good. He eats 3 meals/day. His wife cuts his food in smaller pieces. He is able to feed himself independently. He is mainly continent of both bowel and bladder, but did have a day where he had 4 incontinent episodes. No occurrences since. He does wear adult briefs or pads. He did have some issues with constipation, that has resolved by him taking Miralax daily. Will continue to monitor.  HISTORY OF PRESENT ILLNESS: This is a 75 yo male with  a diagnosis of Parkinson's disease. Palliative care team was asked to follow patient for additional support, goals of care and complex decision making.  CODE STATUS: Full code ADVANCED DIRECTIVES: Y MOST FORM: no PPS: 50%  (Duration of visit and documentation 120 minutes)    , RN BSN  

## 2021-05-06 ENCOUNTER — Other Ambulatory Visit: Payer: Self-pay

## 2021-05-06 ENCOUNTER — Other Ambulatory Visit: Payer: PPO

## 2021-05-06 DIAGNOSIS — Z515 Encounter for palliative care: Secondary | ICD-10-CM

## 2021-05-08 NOTE — Progress Notes (Signed)
PATIENT NAME: Marvin Ingram DOB: 09/07/1945 MRN: [id number]  PRIMARY CARE PROVIDER: [name], MD  RESPONSIBLE PARTY:  Acct ID - Guarantor Home Phone Work Phone Relationship Acct Type  [id number] Marvin, [phone]  Self P/F     [address], [place], [place] [address]   COMMUNITY PALLIATIVE CARE RN NOTE     PLAN OF CARE and INTERVENTION:  ADVANCE CARE PLANNING/GOALS OF CARE: to remain in his home with his wife. CAREGIVER EDUCATION: Safety, Symptom Management. DISEASE STATUS: RN Palliative care telephonic encounter completed today with patient's wife. Per patient's wife, patient has been doing well. He has regained some of the weight he has lost. His current weight is now 143 pounds. He remains dependent in his ADLs. He displays no evidence of pain or distress. Patient's wife would like to speak with a Education officer, museum and meet somewhere other than at home. Wife to reach out to SW. Plan is for patient's wife to call with any needs. HISTORY OF PRESENT ILLNESS: This is a 76- year-old male with a diagnosis of Parkinson's disease. Palliative care has been asked to follow for additional support, care and complex decision making.   CODE STATUS: Full Code PPS: 50% Physical Exam: deferred.     Marvin Moras, RN

## 2021-05-24 DIAGNOSIS — Z9181 History of falling: Secondary | ICD-10-CM | POA: Insufficient documentation

## 2021-07-09 ENCOUNTER — Encounter: Payer: Self-pay | Admitting: Family Medicine

## 2021-07-09 ENCOUNTER — Other Ambulatory Visit: Payer: PPO | Admitting: Family Medicine

## 2021-07-09 VITALS — BP 108/68 | HR 72 | Resp 16

## 2021-07-09 DIAGNOSIS — G20 Parkinson's disease: Secondary | ICD-10-CM

## 2021-07-09 DIAGNOSIS — Z515 Encounter for palliative care: Secondary | ICD-10-CM

## 2021-07-09 DIAGNOSIS — Z789 Other specified health status: Secondary | ICD-10-CM

## 2021-07-09 DIAGNOSIS — Z9181 History of falling: Secondary | ICD-10-CM

## 2021-07-09 NOTE — Progress Notes (Addendum)
? ? ?Manufacturing engineer ?Community Palliative Care Consult Note ?Telephone: [phone]  ?Fax: [phone]  ? ?Date of encounter: 07/09/21 ?9:52 AM ?PATIENT NAME: Marvin Ingram ?[address] ?[place] [address]   ?[phone] (home)  ?DOB: 1945-07-08 ?MRN: [id number] ?PRIMARY CARE PROVIDER:    ?[name], MD,  ?[phone] ?[place] [address] ?[phone] ? ?REFERRING PROVIDER:   ?[name], MD ?[place] ?[place],  [place] [address] ?[phone] ? ?RESPONSIBLE PARTY:    ?Contact Information   ? ? Name Relation Home Work Mobile  ? Gensel,Pamela Spouse [phone]  [phone]  ? Reino,Jennifer Daughter   [phone]  ? ?  ? ? ? ?I met face to face with patient and wife in their home. Palliative Care was asked to follow this patient by consultation request of  [name], MD to address advance care planning and complex medical decision making. This is the initial visit.  ? ? ?      ASSESSMENT, SYMPTOM MANAGEMENT AND PLAN / RECOMMENDATIONS:  ?Palliative care encounter ?Discussed function of MOST form. ?Patient and spouse may want to discuss with PCP at upcoming appointment prior to completion.   ?Currently DNR. ? ?Parkinson's disease ?Occasionally with difficulty swallowing pills.  Discussed use of soft foods like ice cream or applesauce to place pill in and help with swallow. ?Discussed chin tuck method to improve swallow. ?If tremor makes self-feeding difficult, can use weighted utensils. ? ?Fall risk ?Encouraged to use assistive device to increase stability. ?Left third toe pain due to callus, keep scheduled follow-up with podiatry next week. ?May benefit from short-term PT to improve stability, Discuss with PCP. ? ?4.  Needs Assistance with [employer] ?SW Referral initiated for adult day programs and programs for socialization for pt and spouse. ?SW referral for spouse assistance with caregiver stress ? ? ?Follow up Palliative Care Visit: Palliative  care will continue to follow for complex medical decision making, advance care planning, and clarification of goals. Return 4d weeks or prn. ? ? ? ?This visit was coded based on medical decision making (MDM). ? ?PPS: 60% ? ?HOSPICE ELIGIBILITY/DIAGNOSIS: TBD ? ?Chief Complaint:  ? [facility] received a referral to follow up with patient for chronic disease management, advance directive and defining/refining goals of care.  ? ? ?HISTORY OF PRESENT ILLNESS:  Marvin Ingram is a 76 y.o. year old male with Parkinson's Disease, CAD, history of MI status post 2 stents, basal cell carcinoma, seborrheic keratoses, arthritis, bradycardia, hyperlipidemia and palpitations.  Denies CP, SOB, no recent falls.  Using walker most of the time in the last 2 weeks, questionably due to callus of foot. Denies coughing or choking after eating or drinking.  Using Miralax and not having trouble with Bms. Lost 35 lbs over a couple of years prior to having Covid in August 2022.  Eating well currently with some weight gain.  Patient and wife prefer to treat with diet, exercise and no testing if at all possible.  Sleeping well.  Gets up a couple of times nightly to go to the bathroom.  ? ?History obtained from review of EMR, discussion with wife and/or Marvin Ingram.  ?I reviewed available labs, medications, imaging, studies and related documents from the EMR.  Records reviewed and summarized above.  ? ?ROS ?General: NAD ?EYES: denies vision changes ?ENMT: denies dysphagia, has issues with drooling and dry mouth ?Cardiovascular: denies chest pain, denies DOE ?Pulmonary: denies cough, denies increased SOB ?Abdomen:  endorses good appetite, denies constipation, endorses continence of bowel ?GU: denies dysuria, endorses continence of urine ?MSK: Endorses increased weakness, no recent falls reported, has pain in left third toe with callus. ?Skin: denies rashes or wounds ?Neurological:  denies insomnia ?Psych: Endorses  positive mood ?Heme/lymph/immuno: denies bruises, abnormal bleeding ? ?Physical Exam: ?Current and past weights: Weight 11/03/2020 129 pounds 13.6 ounces, 05/13/2021 weight 141 pounds ?Constitutional: NAD ?General: frail appearing, thin ?EYES: anicteric sclera, lids intact, no discharge  ?ENMT: intact hearing, oral mucous membranes moist, dentition intact, hypophonia ?CV: S1S2, RRR, no edema ?Pulmonary: CTAB, no increased work of breathing, no cough, room air ?Abdomen: normo-active BS + 4 quadrants, soft and non tender, no ascites ?GU: deferred ?MSK: no sarcopenia, moves all extremities, ambulatory ?Skin: warm and dry, no rashes or wounds on visible skin ?Neuro:  no generalized weakness, no cognitive impairment ?Psych: non-anxious affect, A and O x 3 ?Hem/lymph/immuno: no widespread bruising ? ?CURRENT PROBLEM LIST:  ?Patient Active Problem List  ? Diagnosis Date Noted  ? At risk for falling 05/24/2021  ? Hip pain   ? Parkinson's disease ([place]) 01/24/2018  ? Gait abnormality 08/13/2017  ? 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 Gout 04/02/2016  ? Palpitations 01/23/2016  ? CAD (coronary artery disease) of artery bypass graft 01/19/2014  ? Hyperlipidemia 01/19/2014  ? ?PAST MEDICAL HISTORY:  ?Active Ambulatory Problems  ?  Diagnosis Date Noted  ? CAD (coronary artery disease) of artery bypass graft 01/19/2014  ? Hyperlipidemia 01/19/2014  ? Palpitations 01/23/2016  ? 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 Hip pain   ? At risk for falling 05/24/2021  ? Gout 04/02/2016  ? Gait abnormality 08/13/2017  ? Parkinson's disease ([place]) 01/24/2018  ? ?Resolved Ambulatory Problems  ?  Diagnosis Date Noted  ? COVID-19 virus infection 10/23/2020  ? ?Past Medical History:  ?Diagnosis Date  ? Arthritis   ? Basal cell carcinoma   ? Bradycardia   ? CAD (coronary artery disease)   ? Heart disease   ? Myocardial infarction [facility]) 2007  ? PONV (postoperative nausea and vomiting)   ? Seborrheic keratoses   ? ?SOCIAL HX:  ?Social History   ? ?Tobacco Use  ? Smoking status: Former  ?  Years: 2.00  ?  Types: Cigarettes  ? Smokeless tobacco: Never  ? Tobacco comments:  ?  smoked off and on during college  ?Substance Use Topics  ? Alcohol use: Not on file  ?  Comment: occassionally  ? ?FAMILY HX: History reviewed. No pertinent family history.   ? ? ?Preferred Pharmacy: ?ALLERGIES:  ?Allergies  ?Allergen Reactions  ? Amoxicillin Diarrhea  ? Doxycycline Diarrhea  ? Gatifloxacin Diarrhea  ? Prednisone   ? Sulfa Antibiotics   ?  Unknown ?  ?   ?PERTINENT MEDICATIONS:  ?Outpatient Encounter Medications as of 07/09/2021  ?Medication Sig  ? acetaminophen (TYLENOL) 500 MG tablet Take 500 mg by mouth every 6 (six) hours as needed (for pain/headache).  ? aspirin EC 81 MG tablet Take 162 mg by mouth daily.  ? carbidopa-levodopa (SINEMET) 25-100 MG tablet Take 1 tablet by mouth 2 (two) times daily.  ? folic acid (FOLVITE) 1 MG tablet Take 1 tablet (1 mg total) by mouth daily.  ? Multiple Vitamin (MULTIVITAMIN WITH MINERALS) TABS tablet Take 1 tablet by mouth daily.  ? polyethylene glycol (MIRALAX / GLYCOLAX) 17 g packet Take 17 g by mouth daily.  ? thiamine 100 MG tablet Take 1 tablet (100 mg total) by  mouth daily.  ? [DISCONTINUED] atorvastatin (LIPITOR) 10 MG tablet TAKE 1/2 TABLET BY MOUTH EVERY DAY (Patient not taking: Reported on 07/09/2021)  ? [DISCONTINUED] feeding supplement (ENSURE ENLIVE / ENSURE PLUS) LIQD Take 237 mLs by mouth 3 (three) times daily between meals. (Patient not taking: Reported on 07/09/2021)  ? ?No facility-administered encounter medications on file as of 07/09/2021.  ? ? ? ?---------------------------------------------------------------------------------------------------------------------------- ?Advance Care Planning/Goals of Care: Goals include to maximize quality of life and symptom management. Patient and health care surrogate gave their permission to discuss.Our advance care planning conversation included a discussion about:    ?The value  and importance of advance care planning-has DNR ?Experiences with loved ones who have been seriously ill or have died-pt's mother-in-law was a hospice patient before she passed away.  Wife volunteered with [facility]

## 2021-07-22 ENCOUNTER — Other Ambulatory Visit: Payer: PPO

## 2021-07-22 DIAGNOSIS — Z515 Encounter for palliative care: Secondary | ICD-10-CM

## 2021-08-07 NOTE — Progress Notes (Signed)
COMMUNITY PALLIATIVE CARE SW NOTE  PATIENT NAME: Marvin Ingram DOB: [birth year] MRN: [id number]  PRIMARY CARE PROVIDER: [name], MD  RESPONSIBLE PARTY:  Acct ID - Guarantor Home Phone Work Phone Relationship Acct Type  [id number] DEMARYIUS, [phone]  Self P/F     [address], [place], [place] [address]     SOCIAL WORK ENCOUNTER  SW completed face-to-face encounter with patient's wife-Marvin Ingram. She tearfully shared that patient has been declining. He has hammer toes and calluses that are impacting his ability to ambulate. He is seeing a podiatrist. Patient is maintaining his weight. He is having increased confusion. Patient is no longer able to go to the [facility] to exercise. Instead he is exercising in the house. Patient is taking his medications, but is now drooling. His wife remains his PCG. She expressed caregiver fatigue, while tearfully expressing optimism that his condition with improve. SW provided supportive counseling to her. SW also provided several resources for in-home caregiver assistance, memory care facilities and day programs that SW followed up with an email. SW provided reassurance of ongoing support, while normalizing her feelings. SW encouraged Marvin Ingram to call with for any additional support or needs.   [address] [place], [place]

## 2021-08-13 ENCOUNTER — Encounter: Payer: Self-pay | Admitting: Family Medicine

## 2021-08-13 ENCOUNTER — Other Ambulatory Visit: Payer: PPO | Admitting: Family Medicine

## 2021-08-13 VITALS — BP 126/82 | HR 58 | Resp 20

## 2021-08-13 DIAGNOSIS — G20 Parkinson's disease: Secondary | ICD-10-CM

## 2021-08-13 DIAGNOSIS — Z515 Encounter for palliative care: Secondary | ICD-10-CM

## 2021-08-13 DIAGNOSIS — Z9181 History of falling: Secondary | ICD-10-CM

## 2021-08-13 NOTE — Progress Notes (Signed)
[facility] Consult Note Telephone: [phone]  Fax: [phone]    Date of encounter: 08/13/21 11:14 AM PATIENT NAME: Marvin In[place] [address]   [phone] (home)  DOB: 05-Sep-1945 MRN: [id number] PRIMARY CARE PROVIDER:    [name], MD,  [address] [place] [address] [phone]  REFERRING PROVIDER:   [name], MD [address] [place],  [place] [address] [phone]  RESPONSIBLE PARTY:    Contact Information     Name Relation Home Work Mobile   Blower,Pamela Spouse [phone]  [phone]   Garza Daughter   [phone]        I met face to face with patient and wife in their home. Palliative Care was asked to follow this patient by consultation request of  [name], MD to address advance care planning and complex medical decision making. This is a follow up visit.  ASSESSMENT, SYMPTOM MANAGEMENT AND PLAN / RECOMMENDATIONS:   Parkinson's Disease with risk for falling Encourage lighted paths Stand slowly before attempting to walk Encouraged to continue exercise such as recumbent bike to build strength/stamina  2.  Palliative Care Encounter Continue to provide support/resources for patient and caregiver coping Continue to follow for goals of care  Advance Care Planning/Goals of Care: Goals include to maximize quality of life and symptom management. Patient and Health care surrogate gave their permission to discuss. Our advance care planning conversation included a discussion about:    Experiences with loved ones who have been seriously ill or have died-wife has volunteered with Hospice in the remote past Exploration of personal, cultural or spiritual beliefs that might influence medical decisions-have deeply held faith Exploration of goals of care in the event of a sudden injury or illness-Last Code status was DNR/DNI but pt and wife have not  committed yet to completion of MOST Identification of a healthcare agent-wife CODE STATUS: Continue to follow up at visits on goals of care    Follow up Palliative Care Visit: Palliative care will continue to follow for complex medical decision making, advance care planning, and clarification of goals. Return 4 weeks or prn.   This visit was coded based on medical decision making (MDM).  PPS: 50%  HOSPICE ELIGIBILITY/DIAGNOSIS: TBD  Chief Complaint:  Palliative Care is following for chronic medical management with Parkinson's disease.  HISTORY OF PRESENT ILLNESS:  Marvin Ingram is a 76 y.o. year old male with Parkinson's Disease, CAD, history of MI status post 2 stents, basal cell carcinoma, seborrheic keratoses, arthritis, bradycardia, hyperlipidemia and palpitations. No falls. Hammer toe is better so he is more tolerant of walking.  Was able to ride recumbent bike x 10 minutes. Has some heel pain, thought to have plantar fasciitis. Eating well.  Denies CP, SOB.  Not having to use Miralax daily but does help with constipation. Rare incontinence of bladder.  Cuts food fine and no coughing or choking. She states having talked with the SW and being unsure if she is ready or willing to participate with the dementia programs offered by [facility] but they are very isolated at home.  History obtained from review of EMR, discussion with wife and/or [name].  I reviewed available labs, medications, imaging, studies and related documents from the EMR.  Records reviewed and summarized above.   ROS  General: NAD EYES: denies vision changes ENMT: denies dysphagia Cardiovascular: denies chest pain, denies DOE Pulmonary: denies cough, denies increased  SOB Abdomen: endorses good appetite, denies constipation currently on bowel regimen of Colace with prn Miralax about every other day, endorses continence of bowel GU: denies dysuria, endorses continence of urine MSK:  denies increased  weakness, no recent falls reported Skin: denies rashes or wounds Neurological: denies pain, denies insomnia Psych: Endorses positive mood Heme/lymph/immuno: denies bruises, abnormal bleeding  Physical Exam: Current and past weights: no recent weight Constitutional: NAD General: frail appearing, thin  EYES: anicteric sclera, lids intact, no discharge  ENMT: mildly hard of hearing, oral mucous membranes moist, dentition intact CV: S1S2, mild regular bradycardic rhythm, no LE edema Pulmonary: CTAB, no increased work of breathing, no cough, room air Abdomen: normo-active BS + 4 quadrants, soft and non tender, no ascites GU: deferred MSK: no sarcopenia, moves all extremities, ambulatory with rolling walker Skin: warm and dry, no rashes or wounds on visible skin Neuro:  no generalized weakness,  noted short term memory loss Psych: non-anxious affect, A and O x 3 Hem/lymph/immuno: no widespread bruising   Thank you for the opportunity to participate in the care of [name].  The palliative care team will continue to follow. Please call our office at [phone] if we can be of additional assistance.   Marijo Conception, FNP -C  COVID-19 PATIENT SCREENING TOOL Asked and negative response unless otherwise noted:   Have you had symptoms of covid, tested positive or been in contact with someone with symptoms/positive test in the past 5-10 days?  no

## 2021-09-10 ENCOUNTER — Encounter: Payer: Self-pay | Admitting: Family Medicine

## 2021-09-10 ENCOUNTER — Other Ambulatory Visit: Payer: PPO | Admitting: Family Medicine

## 2021-09-10 VITALS — BP 102/62 | HR 72 | Resp 18

## 2021-09-10 DIAGNOSIS — Z515 Encounter for palliative care: Secondary | ICD-10-CM

## 2021-09-10 DIAGNOSIS — Z9181 History of falling: Secondary | ICD-10-CM

## 2021-09-10 DIAGNOSIS — G20 Parkinson's disease: Secondary | ICD-10-CM

## 2021-09-10 NOTE — Progress Notes (Signed)
[facility] Palliative Care Consult Note Telephone: [phone]  Fax: [phone]    Date of encounter: 09/10/21 10:52 AM PATIENT NAME: Marvin Ingram [place] [address]   [phone] (home)  DOB: 03-May-1945 MRN: [id number] PRIMARY CARE PROVIDER:    [name], MD,  [address] [place] [address] [phone]  REFERRING PROVIDER:   [name], MD [place],  [place] [address] [phone]  RESPONSIBLE PARTY:    Contact Information     Name Relation Home Work Mobile   Ingram,Marvin Spouse [phone]  [phone]   [place] Daughter   [phone]        I met face to face with patient and wife in their home. Palliative Care was asked to follow this patient by consultation request of  Marvin Sacramento, MD to address advance care planning and complex medical decision making. This is a follow up visit.  ASSESSMENT, SYMPTOM MANAGEMENT AND PLAN / RECOMMENDATIONS:   Parkinson's Disease with risk for falling No falls. Uses rolling walker some in home. Encouraged pt and wife to consider him stepping over something when attempting to turn in a different direction  2.  Palliative Care Encounter Continue to provide support/resources for patient and caregiver coping Continue to follow for goals of care as pt and wife are ready to discuss  Advance Care Planning/Goals of Care: Goals include to maximize quality of life and symptom management.:    Experiences with loved ones who have been seriously ill or have died-wife has volunteered with Hospice in the remote past Exploration of personal, cultural or spiritual beliefs that might influence medical decisions-have deeply held faith Identification of a healthcare agent-wife CODE STATUS: Continue to follow up at visits on goals of care    Follow up Palliative Care Visit: Palliative care will continue to follow for complex medical  decision making, advance care planning, and clarification of goals. Return 4 weeks or prn.   This visit was coded based on medical decision making (MDM).  PPS: 50%  HOSPICE ELIGIBILITY/DIAGNOSIS: TBD  Chief Complaint:  Palliative Care is following for chronic medical management with Parkinson's disease.  HISTORY OF PRESENT ILLNESS:  Marvin Ingram is a 76 y.o. year old male with Parkinson's Disease, CAD, history of MI status post 2 stents, basal cell carcinoma, seborrheic keratoses, arthritis, bradycardia, hyperlipidemia and palpitations. No falls.  Denies CP, SOB.  Not using Miralax daily but still effective. Son from [place] recently visited.Occasional cough with fluids/meds, appetite is good. No falls. Has not been back to the [facility].  Wife is having to help direct him more verbally. Pt states having trouble with attempting to turn and freezing in place.  History obtained from review of EMR, discussion with wife and/or Marvin Ingram.  I reviewed EMR and no recent labs, imaging, studies noted.    ROS General: NAD ENMT: denies dysphagia Cardiovascular: denies chest pain, denies DOE Pulmonary: denies cough, denies increased SOB Abdomen: endorses good appetite, denies constipation currently on bowel regimen of Colace with prn Miralax about every other day, endorses continence of bowel GU: denies dysuria, endorses continence of urine MSK:  denies increased weakness, no recent falls reported Neurological: denies pain, denies insomnia Psych: Endorses positive mood   Physical Exam: Current and past weights: no recent weight Constitutional: NAD General: frail appearing, thin  EYES: anicteric sclera, lids intact, no discharge  ENMT: mildly hard of hearing, oral mucous membranes moist with noted resting  mouth tremors and drooling CV: S1S2, RRR with occasional premature beat, no LE edema Pulmonary: CTAB, no increased work of breathing, no cough, room air Abdomen: normo-active BS + 4  quadrants, soft and non tender MSK: no sarcopenia, moves all extremities, ambulatory with rolling walker, has some resting tremor Neuro:  no generalized weakness,  noted short term memory loss Psych: non-anxious affect, A and O x 3    Thank you for the opportunity to participate in the care of Marvin Ingram.  The palliative care team will continue to follow. Please call our office at [phone] if we can be of additional assistance.   Marvin Conception, Marvin Ingram  COVID-19 PATIENT SCREENING TOOL Asked and negative response unless otherwise noted:   Have you had symptoms of covid, tested positive or been in contact with someone with symptoms/positive test in the past 5-10 days?  no

## 2021-09-17 ENCOUNTER — Other Ambulatory Visit: Payer: PPO | Admitting: Family Medicine

## 2021-09-17 ENCOUNTER — Encounter: Payer: Self-pay | Admitting: *Deleted

## 2021-09-17 VITALS — BP 134/78 | HR 72 | Temp 99.9°F | Resp 18

## 2021-09-17 DIAGNOSIS — R1310 Dysphagia, unspecified: Secondary | ICD-10-CM

## 2021-09-17 DIAGNOSIS — G20 Parkinson's disease: Secondary | ICD-10-CM

## 2021-09-17 NOTE — Progress Notes (Signed)
[facility] Palliative Care Consult Note Telephone: [phone]  Fax: [phone]    Date of encounter: 09/17/21 12:44 PM PATIENT NAME: Marvin Ingram [place] [id number]   [phone] (home)  DOB: 27-Oct-1945 MRN: [id number] PRIMARY CARE PROVIDER:    [name], MD,  [address] [place] [address] [phone]  REFERRING PROVIDER:   [name], MD [place],  [place] [address] [phone]  RESPONSIBLE PARTY:    Contact Information     Name Relation Home Work Mobile   Arreola,Pamela Spouse [phone]  [phone]   [place] Daughter   [phone]        I met face to face with patient and wife in their home. Palliative Care was asked to follow this patient by consultation request of  [name], MD to address advance care planning and complex medical decision making. This is a follow up visit.  ASSESSMENT, SYMPTOM MANAGEMENT AND PLAN / RECOMMENDATIONS:   Parkinson's disease and dysphagia Likely infectious illness related to acute onset weakness. Portable CXR Start Cefdinir 277m/5ml -give 6 ml (300 mg) BID x 10 days.  (Will cover urine and chest) Upright for all intake Encourage fluid intake.      Follow up Palliative Care Visit: Palliative care will continue to follow for complex medical decision making, advance care planning, and clarification of goals. Return 4 weeks or prn.   This visit was coded based on medical decision making (MDM).  PPS: 50%  HOSPICE ELIGIBILITY/DIAGNOSIS: TBD  Chief Complaint:  Received call from wife overnight indicating that pt is weak and cannot stand, asking for acute visit to check him.  HISTORY OF PRESENT ILLNESS:  JCARRICK RIJOSis a 76y.o. year old male with Parkinson's Disease, CAD, history of MI status post 2 stents, basal cell carcinoma, seborrheic keratoses, arthritis, bradycardia, hyperlipidemia and palpitations. Pt  was seen lying in bed, lethargic and sleepy.  He was having trouble with getting up and had to be assisted to stand.  He will freeze at times while attempting to walk. Denies fever, nausea and vomiting.  Lacks appetite and wife has been trying to get fluids in him but has avoided solids with him laying in bed.  He is noted to be coughing after intake and he drools a lot.  Has not had a BM in 2 days.  History obtained from review of EMR, discussion with wife and/or Mr. DRitzel    ROS obtained from pt and wife General: NAD ENMT: wife endorses increased cough with intake Cardiovascular: denies chest pain, denies DOE Pulmonary: endorses cough after intake, denies increased SOB Abdomen: endorses poor appetite, endorses continence of bowel GU: denies dysuria, endorses continence of urine MSK:  endorses increased weakness, no recent falls reported Neurological: denies pain, denies insomnia    Physical Exam: Current and past weights: no recent weight Constitutional: NAD General: frail appearing, thin  ENMT: mildly hard of hearing, hypophonic and drooling CV: S1S2, RRR no LE edema Pulmonary: CTAB except bibasilar crackles, no increased work of breathing, cough after drinking, room air Abdomen: normo-active BS + 4 quadrants, soft and non tender MSK: noted sarcopenia of BLE, moves all extremities, slowly ambulatory with unsteady gait using rolling walker, has some resting tremor Neuro:  noted generalized weakness,  noted short term memory loss Psych: non-anxious affect, A and O x 3    Thank you for the opportunity to participate in the care of Marvin Ingram  The palliative care team will continue to follow. Please call our office at [phone] if we can be of additional assistance.   Marijo Conception, FNP -C  COVID-19 PATIENT SCREENING TOOL Asked and negative response unless otherwise noted:   Have you had symptoms of covid, tested positive or been in contact with someone with  symptoms/positive test in the past 5-10 days?  no

## 2021-09-17 NOTE — Progress Notes (Signed)
Portable 2-view chest x-ray requested by Damaris Hippo FNP due to fever and dysphagia. Faxed orders to [facility] as requested. Confirmation fax received.

## 2021-09-18 ENCOUNTER — Encounter: Payer: Self-pay | Admitting: Family Medicine

## 2021-09-18 DIAGNOSIS — R1310 Dysphagia, unspecified: Secondary | ICD-10-CM | POA: Insufficient documentation

## 2021-10-20 ENCOUNTER — Other Ambulatory Visit: Payer: PPO | Admitting: Family Medicine

## 2021-10-20 ENCOUNTER — Encounter: Payer: Self-pay | Admitting: Family Medicine

## 2021-10-20 VITALS — BP 104/64 | HR 67 | Resp 20

## 2021-10-20 DIAGNOSIS — Z515 Encounter for palliative care: Secondary | ICD-10-CM

## 2021-10-20 DIAGNOSIS — R1310 Dysphagia, unspecified: Secondary | ICD-10-CM

## 2021-10-20 DIAGNOSIS — G20 Parkinson's disease: Secondary | ICD-10-CM

## 2021-10-20 NOTE — Progress Notes (Signed)
[facility] Palliative Care Consult Note Telephone: [phone]  Fax: [phone]    Date of encounter: 10/20/21 12:45 PM PATIENT NAME: Marvin Ingram [place] [address]   [phone] (home)  DOB: 1945-11-10 MRN: [id number] PRIMARY CARE PROVIDER:    [name], MD,  [address] [place] [address] [phone]  REFERRING PROVIDER:   [name], MD [place],  [place] [address] [phone]  RESPONSIBLE PARTY:    Contact Information     Name Relation Home Work Mobile   Marvin Ingram Spouse [phone]  [phone]   [place] Daughter   [phone]        I met face to face with patient and wife in their home. Palliative Care was asked to follow this patient by consultation request of  [name], MD to address advance care planning and complex medical decision making. This is a follow up visit.  ASSESSMENT, SYMPTOM MANAGEMENT AND PLAN / RECOMMENDATIONS:   Parkinson's disease and dysphagia Most recent CXR was negative for PNA, likely infection may have been UTI so antibiotics were d/c'd with negative CXR. At baseline with PD, ambulatory in home with cane today Continue small, frequent meals to supplement protein.  2.    Palliative Care Encounter Advised wife of natural progression and s/sx of NM disease which can impact autonomic regulation of BP in PD. Wife reading a Dementia Caregiver Workbook written by a Neurologist in [place] with [facility]  Validated wife's feelings regarding recent infection and threats to patient's wellbeing.     Follow up Palliative Care Visit: Palliative care will continue to follow for complex medical decision making, advance care planning, and clarification of goals. Return 4 weeks or prn.   This visit was coded based on medical decision making (MDM).  PPS: 50%  HOSPICE ELIGIBILITY/DIAGNOSIS: TBD  Chief Complaint:   Palliative Care  is following for medical management of chronic conditions in setting of Parkinson's disease with memory deficits.    HISTORY OF PRESENT ILLNESS:  Marvin Ingram is a 76 y.o. year old male with Parkinson's Disease, CAD, history of MI status post 2 stents, basal cell carcinoma, seborrheic keratoses, arthritis, bradycardia, hyperlipidemia and palpitations. Pt is having a good day with his speech.  He recently had a visit from his son which always brightens his outlook.  He has had no falls, appetite is good.  Managing constipation with Miralax.  No infections since most recent febrile illness and weakness. Pt has improved and is walking in the home with a cane not a walker. Denies CP, SOB, nausea or vomiting.  History obtained from review of EMR with no recent labs, discussion with wife and/or Marvin Ingram.    ROS obtained from pt and wife General: NAD ENMT: wife endorses stable cough with intake intermittently but non-productive and no fever Cardiovascular: denies chest pain, denies DOE Pulmonary: endorses cough after intake occasionally, denies increased SOB Abdomen: endorses poor appetite, endorses continence of bowel GU: denies dysuria, endorses continence of urine MSK:  endorses increased weakness, no recent falls reported Neurological: denies pain, denies insomnia    Physical Exam: Current and past weights: no recent weight Constitutional: NAD General: frail appearing, thin  ENMT: mildly hard of hearing, occasional        drooling CV: S1S2, RRR no LE edema Pulmonary: CTAB, no increased work of breathing, no cough, room air Abdomen: normo-active BS + 4 quadrants, soft and non tender MSK: noted  sarcopenia of BLE, moves all extremities, slowly ambulatory with cane, has some resting tremor in bilat hands Neuro:  noted generalized weakness,  noted short term memory loss Psych: non-anxious affect, A and O x 3    Thank you for the opportunity to participate in the care of Marvin Ingram.   The palliative care team will continue to follow. Please call our office at [phone] if we can be of additional assistance.   Marvin Conception, FNP -C  COVID-19 PATIENT SCREENING TOOL Asked and negative response unless otherwise noted:   Have you had symptoms of covid, tested positive or been in contact with someone with symptoms/positive test in the past 5-10 days?  no

## 2021-11-12 ENCOUNTER — Encounter: Payer: Self-pay | Admitting: Family Medicine

## 2021-11-12 ENCOUNTER — Other Ambulatory Visit: Payer: PPO | Admitting: Family Medicine

## 2021-11-12 VITALS — BP 102/76 | HR 69 | Resp 20

## 2021-11-12 DIAGNOSIS — R1310 Dysphagia, unspecified: Secondary | ICD-10-CM

## 2021-11-12 DIAGNOSIS — G20 Parkinson's disease: Secondary | ICD-10-CM

## 2021-11-12 DIAGNOSIS — Z515 Encounter for palliative care: Secondary | ICD-10-CM

## 2021-11-12 NOTE — Progress Notes (Signed)
[facility] Palliative Care Consult Note Telephone: [phone]  Fax: [phone]    Date of encounter: 11/12/21 10:44 AM PATIENT NAME: Marvin Ingram [id number]   [phone] (home)  DOB: Jan 12, 1946 MRN: [id number] PRIMARY CARE PROVIDER:    [name], MD,  [address] [place] [address] [phone]  REFERRING PROVIDER:   [name], MD [place],  [place] [address] [phone]  RESPONSIBLE PARTY:    Contact Information     Name Relation Home Work Mobile   Gras,Marvin Ingram Spouse [phone]  [phone]   Marvin Ingram Daughter   [phone]        I met face to face with patient and wife in their home. Palliative Care was asked to follow this patient by consultation request of  Marvin Sacramento, MD to address advance care planning and complex medical decision making. This is a follow up visit.  ASSESSMENT, SYMPTOM MANAGEMENT AND PLAN / RECOMMENDATIONS:   Parkinson's disease, difficulty swallowing pills Stable at baseline with PD, ambulatory in home today. Needs simple step by step reminders or demonstration of how to do things like sitting or brushing teeth Encouraged can crush pills as long as not long acting or sinemet, add to applesauce, pudding or ice cream to help patient swallow Use of chin tuck to aid swallow with follow up with multiple sips of liquids Continue small, frequent meals to supplement protein.  2.    Palliative Care Encounter SW referral for in home assistance with pt's care. Advised that in-home assistance with [facility] agency providing skilled PT or nursing is covered many times by insurance but caregiver for custodial (non-skilled care) is not. Advised to check in with insurance to see what providers covered as wife thinks they do have custodial care included.     Follow up Palliative Care Visit: Palliative care will continue to follow for complex  medical decision making, advance care planning, and clarification of goals. Return 4 weeks or prn.   This visit was coded based on medical decision making (MDM).  PPS: 50%  HOSPICE ELIGIBILITY/DIAGNOSIS: TBD  Chief Complaint:   Palliative Care is following for medical management of chronic conditions in setting of Parkinson's disease with memory deficits.    HISTORY OF PRESENT ILLNESS:  Marvin Ingram is a 76 y.o. year old male with Parkinson's Disease, CAD, history of MI status post 2 stents, basal cell carcinoma, seborrheic keratoses, arthritis, bradycardia, hyperlipidemia and palpitations. Urinary incontinence, still continent of bowel.  Has to have help getting his ASA down and now putting in food.  No falls.  Wife bought a Scientist, forensic. Having good Bms with [employer].  Pain in hammertoe. His son is coming in tonight.  Denies CP, SOB, nausea or vomiting, or infections. Continues to drool and primarily when he gets excited or upset he will drool more, per wife. Was tearful as wife discussing pt's mother who has passed away.  History obtained from review of EMR with no recent labs, discussion with wife and/or Marvin Ingram.    ROS obtained from pt and wife General: NAD ENMT:  no cough  Cardiovascular: denies chest pain, denies DOE Pulmonary: endorses cough after intake occasionally and difficulty with swallowing aspirin, denies increased SOB Abdomen: endorses improved appetite, endorses continence of bowel GU: denies dysuria, endorses incontinence of urine MSK:  endorses increased weakness, no recent falls reported Neurological: has pain in "hammertoe", denies insomnia    Physical  Exam: Current and past weights: no recent weight Constitutional: NAD General: frail appearing, thin  ENMT: mildly hard of hearing, intermittent drooling CV: S1S2, RRR no LE edema Pulmonary: CTAB, no increased work of breathing, no cough, room air Abdomen: normo-active BS + 4 quadrants,  soft and non tender MSK: noted sarcopenia of BLE, moves all extremities, slowly ambulatory with unsteady gait/balance, has some resting tremor in bilat hands Neuro:  noted generalized weakness,  noted short term memory loss Psych: non-anxious affect, A and O x 3, tearful                                               Thank you for the opportunity to participate in the care of Marvin Ingram.  The palliative care team will continue to follow. Please call our office at [phone] if we can be of additional assistance.   Marvin Conception, FNP -C  COVID-19 PATIENT SCREENING TOOL Asked and negative response unless otherwise noted:   Have you had symptoms of covid, tested positive or been in contact with someone with symptoms/positive test in the past 5-10 days?  no

## 2021-12-10 ENCOUNTER — Other Ambulatory Visit: Payer: PPO | Admitting: Family Medicine

## 2021-12-10 ENCOUNTER — Encounter: Payer: Self-pay | Admitting: Family Medicine

## 2021-12-10 VITALS — BP 112/78 | HR 55 | Resp 18

## 2021-12-10 DIAGNOSIS — G20A1 Parkinson's disease without dyskinesia, without mention of fluctuations: Secondary | ICD-10-CM

## 2021-12-10 DIAGNOSIS — R1310 Dysphagia, unspecified: Secondary | ICD-10-CM

## 2021-12-10 NOTE — Progress Notes (Unsigned)
[facility] Palliative Care Consult Note Telephone: [phone]  Fax: [phone]    Date of encounter: 12/10/21 10:55 AM PATIENT NAME: Marvin Ingram [place] [address]   [phone] (home)  DOB: 04-Jul-1945 MRN: [id number] PRIMARY CARE PROVIDER:    [name], MD,  [address] [place] [address] [phone]  REFERRING PROVIDER:   [name], MD [place],  [place] [address] [phone]  RESPONSIBLE PARTY:    Contact Information     Name Relation Home Work Mobile   Marvin Ingram Spouse [phone]  [phone]   [place] Daughter   [phone]        I met face to face with patient and wife in their home. Palliative Care was asked to follow this patient by consultation request of  Marvin Sacramento, MD to address advance care planning and complex medical decision making. This is a follow up visit.  ASSESSMENT, SYMPTOM MANAGEMENT AND PLAN / RECOMMENDATIONS:   Parkinson's disease, difficulty swallowing pills Stable at baseline with PD. Doing well putting meds in soft foods to aid swallowing. Allowing increased response times for pt    Follow up Palliative Care Visit: Palliative care will continue to follow for complex medical decision making, advance care planning, and clarification of goals. Return 4 weeks or prn.   This visit was coded based on medical decision making (MDM).  PPS: 50%  HOSPICE ELIGIBILITY/DIAGNOSIS: TBD  Chief Complaint:   Palliative Care is following for chronic medical management in setting of Parkinson's Disease.    HISTORY OF PRESENT ILLNESS:  Marvin Ingram is a 76 y.o. year old male with Parkinson's Disease, CAD, history of MI status post 2 stents, basal cell carcinoma, seborrheic keratoses, arthritis, bradycardia, hyperlipidemia and palpitations. Urinary incontinence, still continent of bowel.  After last visit he got up from a nap  confused and walked outside to the neighbors which he had not done previously or since. No infections or falls. Eating well.  No nausea, vomiting, CP, SOB. Takes Miralax a couple of times per week and is doing well with elimination.  History obtained from review of EMR with no recent labs, discussion with wife and/or Marvin Ingram.    ROS obtained from pt and wife General: NAD ENMT:  no cough  Cardiovascular: denies chest pain, denies DOE Pulmonary: endorses difficulty with swallowing aspirin at times and mixing with food to improve swallow, denies increased SOB Abdomen: endorses good appetite, endorses continence of bowel GU: denies dysuria, endorses incontinence of urine MSK:  endorses increased weakness, no recent falls reported Neurological: has pain in "hammertoe", denies insomnia    Physical Exam: Current and past weights: no recent weight Constitutional: NAD General: frail appearing, thin  ENMT: mildly hard of hearing, intermittent drooling CV: S1S2, slow bradycardic rate , trace pedal edema Pulmonary: CTAB, no increased work of breathing, no cough, room air Abdomen: normo-active BS + 4 quadrants, soft and non tender MSK: noted sarcopenia of BLE, moves all extremities, slowly ambulatory with unsteady gait/balance, has some resting tremor in bilat hands Neuro:  noted generalized weakness,  noted short term memory loss, psychomotor retardation Psych: non-anxious affect, A and O x 3                                           Thank you for the opportunity to  participate in the care of Marvin Ingram.  The palliative care team will continue to follow. Please call our office at [phone] if we can be of additional assistance.   Marvin Conception, FNP -C  COVID-19 PATIENT SCREENING TOOL Asked and negative response unless otherwise noted:   Have you had symptoms of covid, tested positive or been in contact with someone with symptoms/positive test in the past 5-10 days?  no

## 2021-12-11 ENCOUNTER — Encounter: Payer: Self-pay | Admitting: Family Medicine

## 2022-01-05 ENCOUNTER — Other Ambulatory Visit: Payer: PPO | Admitting: Family Medicine

## 2022-01-05 ENCOUNTER — Encounter: Payer: Self-pay | Admitting: Family Medicine

## 2022-01-05 VITALS — BP 118/72 | HR 64 | Resp 18

## 2022-01-05 DIAGNOSIS — R269 Unspecified abnormalities of gait and mobility: Secondary | ICD-10-CM

## 2022-01-05 DIAGNOSIS — F02A0 Dementia in other diseases classified elsewhere, mild, without behavioral disturbance, psychotic disturbance, mood disturbance, and anxiety: Secondary | ICD-10-CM

## 2022-01-05 DIAGNOSIS — G20A1 Parkinson's disease without dyskinesia, without mention of fluctuations: Secondary | ICD-10-CM

## 2022-01-05 NOTE — Progress Notes (Signed)
[facility] Consult Note Telephone: [phone]  Fax: [phone]    Date of encounter: 01/05/22 11:29 AM PATIENT NAME: Marvin Ingram [place] [id number]   [phone] (home)  DOB: [birth year] MRN: [id number] PRIMARY CARE PROVIDER:    [name], MD,  [address] [place] [address] [phone]  REFERRING PROVIDER:   [name], MD [place],  [place] [address] [phone]  RESPONSIBLE PARTY:    Contact Information     Name Relation Home Work Mobile   Gibeault,Pamela Spouse [phone]  [phone]   [place] Daughter   [phone]        I met face to face with patient and wife in their home. Palliative Care was asked to follow this patient by consultation request of  [name], MD to address advance care planning and complex medical decision making. This is a follow up visit.  ASSESSMENT, SYMPTOM MANAGEMENT AND PLAN / RECOMMENDATIONS:   Parkinson's disease Stable at baseline with PD-has some noted freezing episodes. Stable swallowing with attention to soft foods and increasing moisture of soft food to help with pills. Continue to help with freezing by telling pt to step over something so he can resume moving.  2.    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 FAST 7 score 5 Memory deficits with some impaired judgement and impaired safety awareness. Encouraged wife to mimic behavior for patient to help him understand. Allow increased time for instruction or questions asked to be absorbed for response.  3.    Gait abnormality Has episodes of freezing, walks slightly stooped over. Encouraged to use assistive devices including walker or cane. Can encourage marching to help with getting movement going again Encourage stretching exercise and slow position change before attempting to stand to allow BP to settle and avoid  severe orthostasis.    Follow up Palliative Care Visit: Palliative care will continue to follow for complex medical decision making, advance care planning, and clarification of goals. Return 4 weeks or prn.   This visit was coded based on medical decision making (MDM).  PPS: 50%  HOSPICE ELIGIBILITY/DIAGNOSIS: TBD  Chief Complaint:  Palliative Care is continuing to follow patient for chronic medical management in setting of Parkinsons with memory.   HISTORY OF PRESENT ILLNESS:  Marvin Ingram is a 76 y.o. year old male with Parkinson's Disease, CAD, history of MI status post 2 stents, basal cell carcinoma, seborrheic keratoses, arthritis, bradycardia,  hyperlipidemia and palpitations. Urinary incontinence with some urinary hesitation and wearing assurance briefs, still continent of bowel. Eating well with no visible weight loss.  No nausea, vomiting, CP, SOB. Takes Miralax about 7 times per month. No recent episodes of wandering.  Pt and wife have been walking some in the park using a transport chair on days when weather is good.  History obtained from review of EMR with no recent labs, discussion with wife and/or Mr. Cortez.    ROS obtained from pt and wife General: NAD ENMT:  no cough  Cardiovascular: denies chest pain, denies DOE Pulmonary: endorses difficulty with swallowing, denies increased SOB Abdomen: endorses good appetite, endorses continence of bowel GU: denies dysuria, endorses incontinence of urine, urinary hesitation MSK:  endorses increased weakness, no recent falls reported Neurological: continues to have pain in "hammertoe", denies insomnia    Physical Exam: Current and past weights: no recent weight Constitutional: NAD General: frail appearing, thin  ENMT:  mildly hard of hearing, intermittent drooling CV: S1S2, RRR , no BLE edema Pulmonary: CTAB, no increased work of breathing, no cough, room air Abdomen: normo-active BS + 4 quadrants, soft and non  tender MSK: noted sarcopenia of BLE, moves all extremities, slowly ambulatory with unsteady gait/balance and some forward stooped posture Neuro:  noted generalized weakness,  noted short term memory loss, freezing episodes Psych: non-anxious affect, A and O x 3                                             Thank you for the opportunity to participate in the care of Mr. Glander.  The palliative care team will continue to follow. Please call our office at [phone] if we can be of additional assistance.   Marijo Conception, FNP -C  COVID-19 PATIENT SCREENING TOOL Asked and negative response unless otherwise noted:   Have you had symptoms of covid, tested positive or been in contact with someone with symptoms/positive test in the past 5-10 days?  no

## 2022-01-24 ENCOUNTER — Encounter: Payer: Self-pay | Admitting: Family Medicine

## 2022-01-24 DIAGNOSIS — F02A0 Dementia in other diseases classified elsewhere, mild, without behavioral disturbance, psychotic disturbance, mood disturbance, and anxiety: Secondary | ICD-10-CM | POA: Insufficient documentation

## 2022-04-17 ENCOUNTER — Telehealth: Payer: Self-pay | Admitting: Family Medicine

## 2022-04-17 ENCOUNTER — Other Ambulatory Visit: Payer: PPO

## 2022-04-17 DIAGNOSIS — F02A0 Dementia in other diseases classified elsewhere, mild, without behavioral disturbance, psychotic disturbance, mood disturbance, and anxiety: Secondary | ICD-10-CM

## 2022-04-17 DIAGNOSIS — R269 Unspecified abnormalities of gait and mobility: Secondary | ICD-10-CM

## 2022-04-17 DIAGNOSIS — G20A1 Parkinson's disease without dyskinesia, without mention of fluctuations: Secondary | ICD-10-CM

## 2022-04-17 DIAGNOSIS — Z515 Encounter for palliative care: Secondary | ICD-10-CM

## 2022-04-17 DIAGNOSIS — R1310 Dysphagia, unspecified: Secondary | ICD-10-CM

## 2022-04-17 NOTE — Telephone Encounter (Signed)
[name] from Dr Rich Fuchs office called to give referral for [facility] with diagnosis of Parkinson's Disease with life expectancy of 6 months or less with current disease course and is willing to remain as attending provider.  Damaris Hippo FNP-C

## 2022-04-17 NOTE — Telephone Encounter (Signed)
TCT Dr Rich Fuchs office.  Advised that I had spoken with pt's wife [name] and pt is declining with decreased intake and increased time to consume any intake, poorer mobility with generalized weakness and increased need for assistance for any mobility.  Advised pt having fecal incontinence of soft, mushy stool without improvement on bland diet.  Advised that wife is ok with Hospice referral if PCP in agreement.  Advised that he has been getting PT but is generally more weak. Spoke with receptionist at Dr Dois Davenport office to advise that pt is incontinent and having more mushy stool but without fever, nausea, vomiting or blood in stool. Taking more time to get in nutrition and believe that pt is more frail with loss of muscle mass, having some freezing episodes, requiring increased assistance for mobilization even with rolling walker and PT with decreased ability to bathe or dress independently.  Asked if PCP agreeable that pt on current course has a life expectancy of 6 months or less and if he would want to be attending for patient.  Provided name and phone number for call back.  Damaris Hippo FNP-C

## 2022-04-17 NOTE — Telephone Encounter (Signed)
TCT Gifford Shave, pt's spouse to discuss his current presentation and options.  Received call from [facility], SW for [facility]iative.  She states pt's wife called requesting resumption of care as pt is incontinent of bowel, weak, having difficulty eating and taking increased time to eat.  She had also reported that pt was having to exert significantly more energy to be able to ambulate and was requiring more assistance from her to be able to do so.  She has been unable to get him in to bathe or shower and has resorted to sponge bathing him.  She said that this had been particularly difficult in the last week.  Monica had mentioned hospice services but said that the patient's wife wasn't sure and asked me to follow up with her since I had been following him previously.  Lucy [place] that I believe he is hospice appropriate and would give her the most benefit.  Spoke with [name] who states that pt had been doing PT but had been having more episodes of fecal incontinence and sometimes he was aware it was coming and at others he did not.  She had been doing a bland diet with him but he continues to have incontinence after eating of soft, mushy stools.  Denies fever, abd pain, nausea, vomting or blood in stools.  She had contacted his PCP for advice and reached out to [facility].  Wife is  housebound with spouse as he is somewhat confused and requires her assistance for mobility.  She does indicate some episodes of freezing where he wants to move but can't seem to do so.  She is tired with trying to help him and supervise him for his safety but they want him to remain at home where he is comfortable. Believe that pt is likely losing weight and muscle mass and continuing to generally decline.  Encouraged her to consider Hospice and advised that this will help provide more assistance for them to be able to manage at home.  She is agreeable if Dr Redmond Pulling is in agreement.  Damaris Hippo FNP-C

## 2022-04-17 NOTE — Progress Notes (Signed)
[facility] SW NOTE  PATIENT NAME: Marvin Ingram DOB: 07-04-1945 MRN: [id number]  PRIMARY CARE PROVIDER: [name], MD  RESPONSIBLE PARTY:  Acct ID - Guarantor Home Phone Work Phone Relationship Acct Type  [id number] LYNDELL, [phone]  Self P/F     [address], [place], [place] [address]   Social Work Telephonic Encounter  PC SW returned a call to patient's wife-[name]. She advised th patient has been having fecal and urinary incontinence that seems to be worsening. He is having increased weakness where he is now needing to use his walker, but his gait has been unsteady. Patient is having difficulty swallowing his food and she is switched to a liquid diet. She report that his intake has declined overall and he is drooling. He had some weight loss. SW talked with [name] about hospice care. She was torn as she see patient is declining, but remains optimistic that he will get better. Because SW had not seen him in over a year, SW advised her that she will reach out the palliative NP to follow-up with her to discuss this further. [name] agreed to this.  SW requested follow-up from NP-K. Highfill. She agreed to follow-up with patient's wife regarding hospice consult.   Social History   Tobacco Use   Smoking status: Former    Years: 2.00    Types: Cigarettes   Smokeless tobacco: Never   Tobacco comments:    smoked off and on during college  Substance Use Topics   Alcohol use: Not on file    Comment: occassionally    Duration of telephonic encounter and documentation: 30 minutes. [address] [place], [place]

## 2022-08-21 IMAGING — MR MR HIP*L* W/O CM
6 series · 33 of 40 positions shown · non-contrast
Comparison: Radiograph 10/23/2020

CLINICAL DATA: Hip pain, stress fracture suspected, neg xray
possible musculoskeletal injury

EXAM:
MR OF THE LEFT HIP WITHOUT CONTRAST
TECHNIQUE: Multiplanar, multisequence MR imaging was performed. No intravenous
contrast was administered.

[Series 8: T2 fat-sat · coronal · left · 4.0mm · 1.00mm/px · 8 of 40 slices shown (1 of 3)]
[im 1/40]
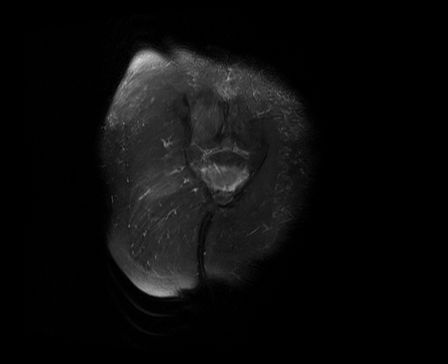
[im 6/40]
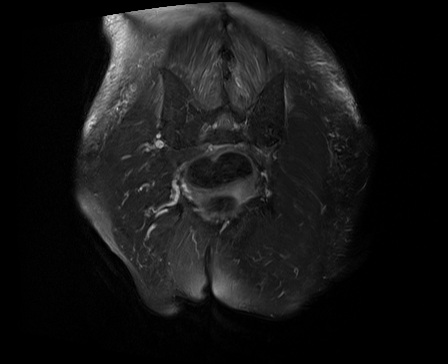
[im 12/40]
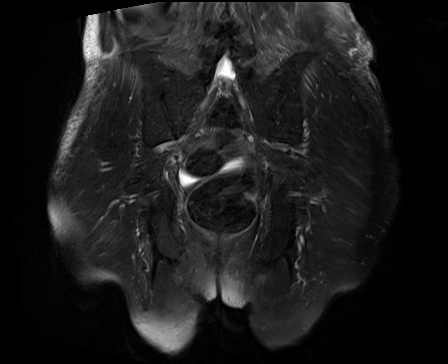
[im 17/40]
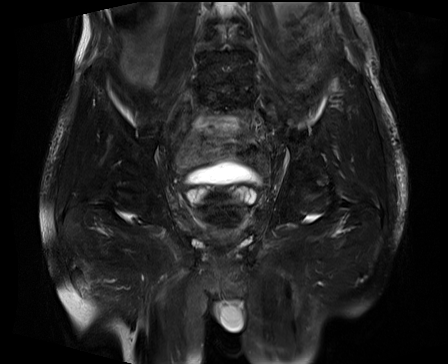
[im 23/40]
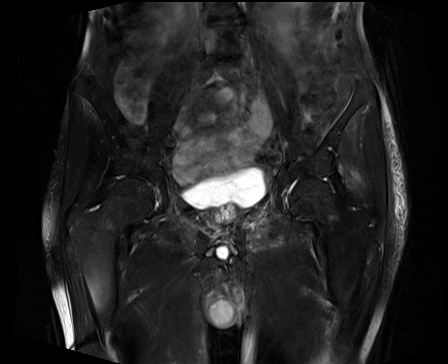
[im 28/40]
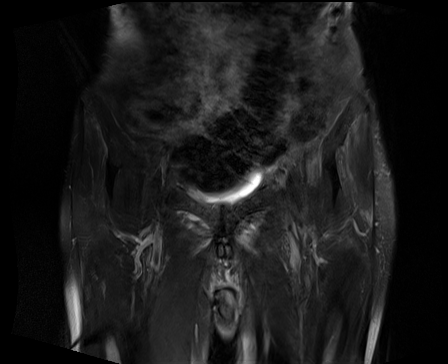
[im 34/40]
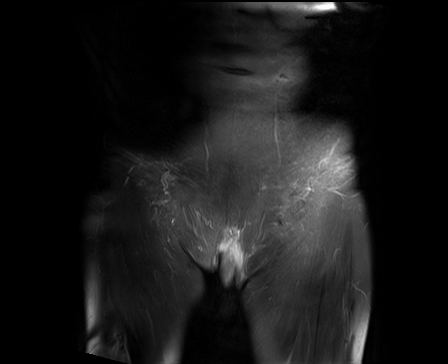
[im 40/40]
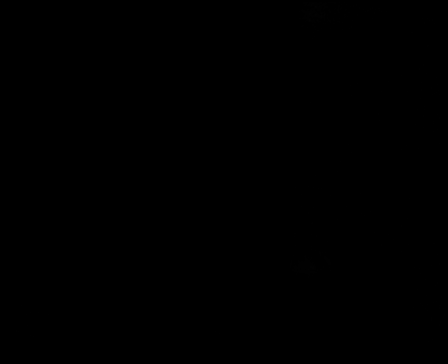

[Series 9: T1 · coronal · left · 4.0mm · 1.17mm/px · 1 of 40 slices shown]
[im 1/40]
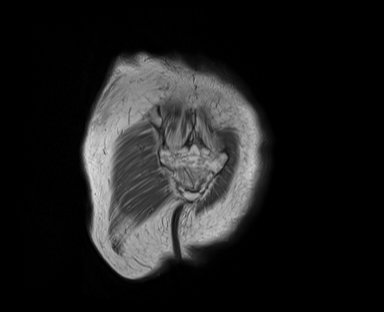

[Series 10: T2 fat-sat · axial · left · 4.0mm · 0.35mm/px · z∈[-93,+77]mm · 7 of 35 slices shown (2 of 3)]
[im 1/35]
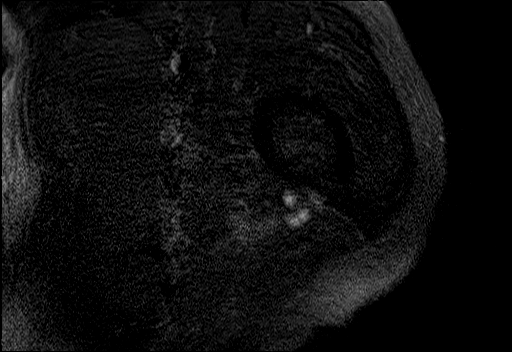
[im 6/35]
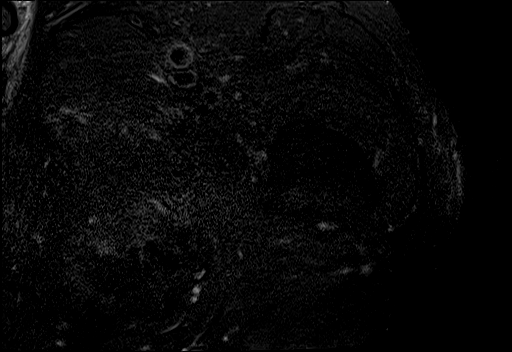
[im 12/35]
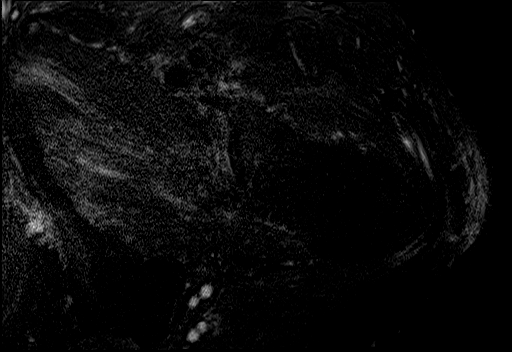
[im 18/35]
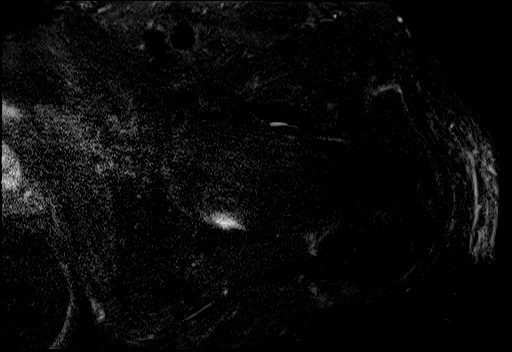
[im 23/35]
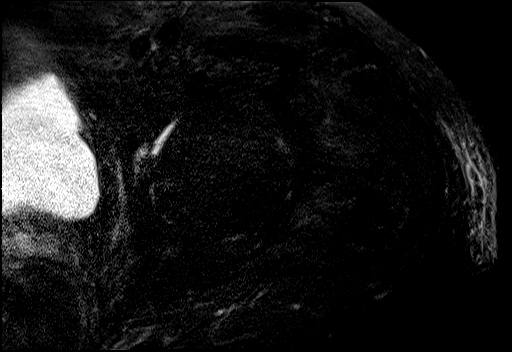
[im 29/35]
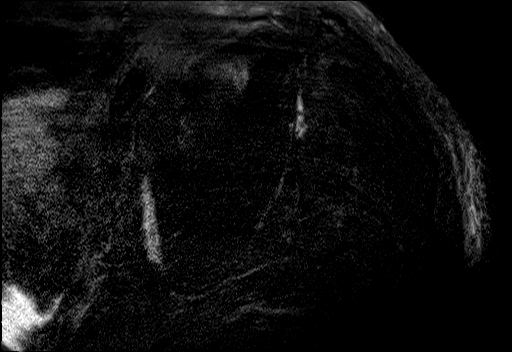
[im 35/35]
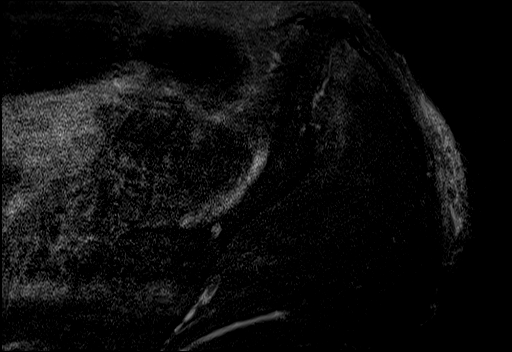

[Series 12: PD fat-sat · sagittal · left · 4.0mm · 0.56mm/px · 6 of 28 slices shown (1 of 2)]
[im 1/28]
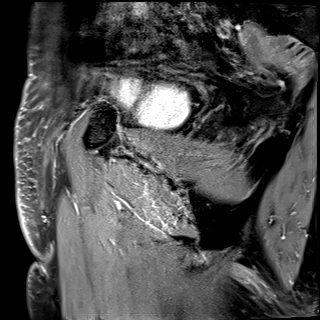
[im 6/28]
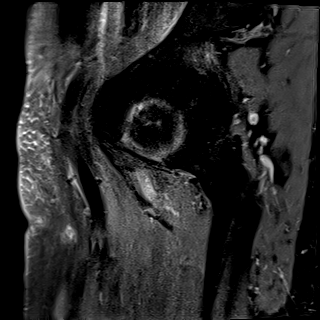
[im 11/28]
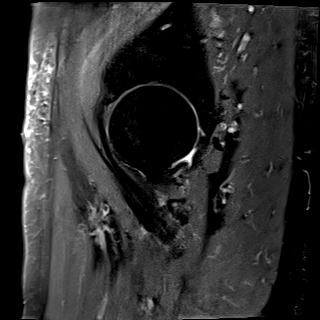
[im 17/28]
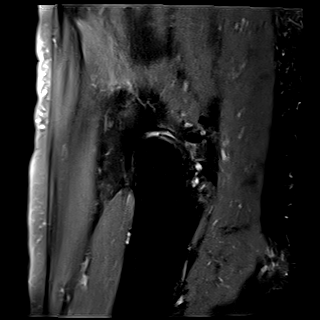
[im 22/28]
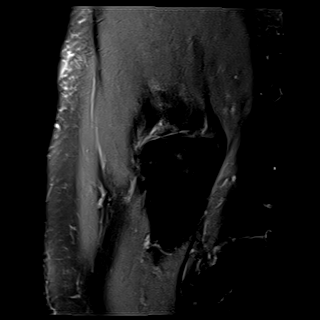
[im 28/28]
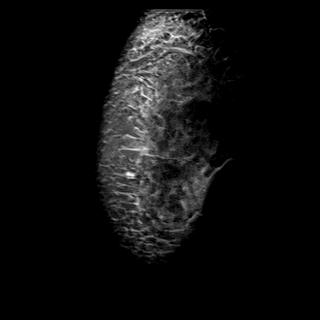

[Series 13: PD fat-sat · coronal · left · 3.0mm · 0.70mm/px · 4 of 20 slices shown (2 of 2)]
[im 1/20]
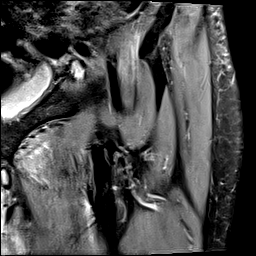
[im 7/20]
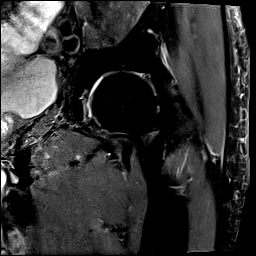
[im 13/20]
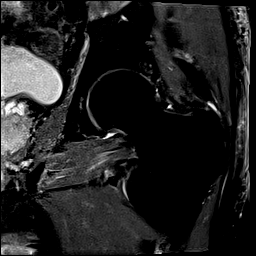
[im 20/20]
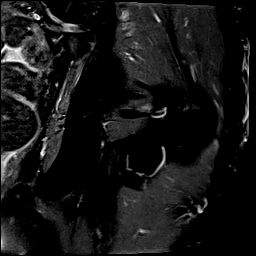

[Series 14: T2 fat-sat · axial · left · 4.0mm · 0.35mm/px · z∈[-93,+77]mm · 7 of 35 slices shown (3 of 3)]
[im 1/35]
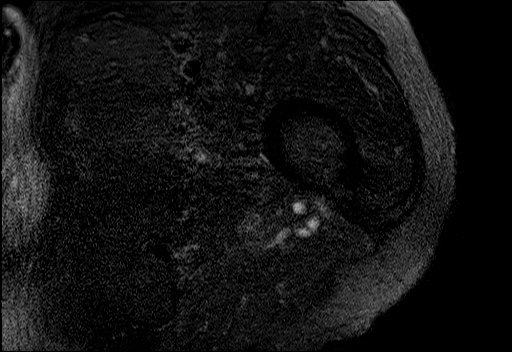
[im 6/35]
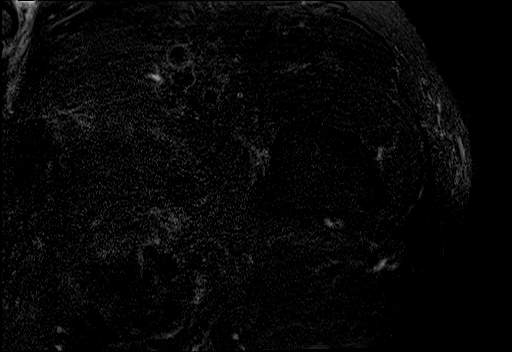
[im 12/35]
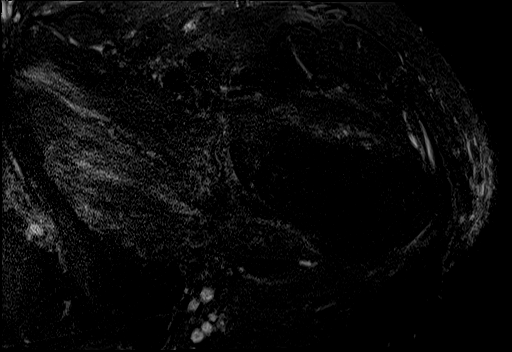
[im 18/35]
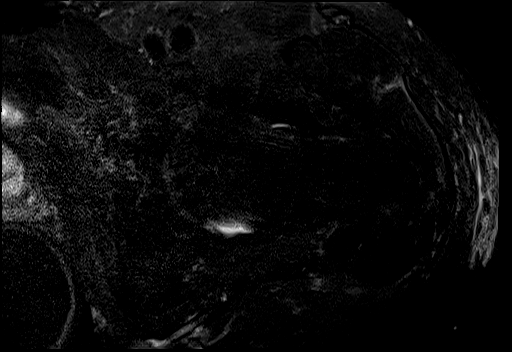
[im 23/35]
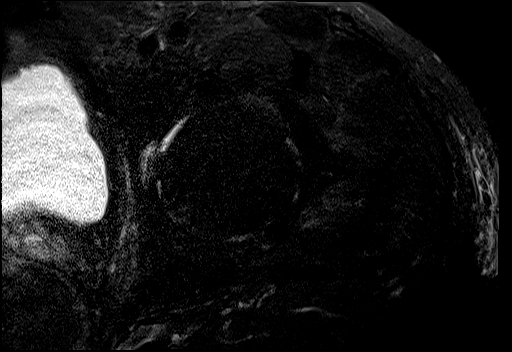
[im 29/35]
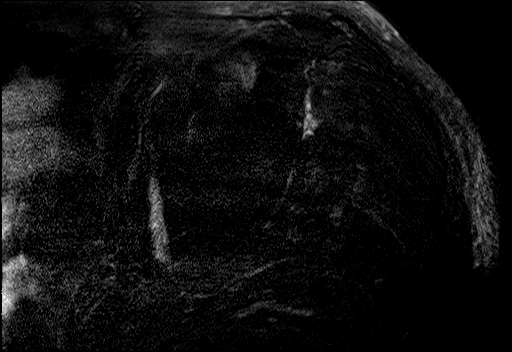
[im 35/35]
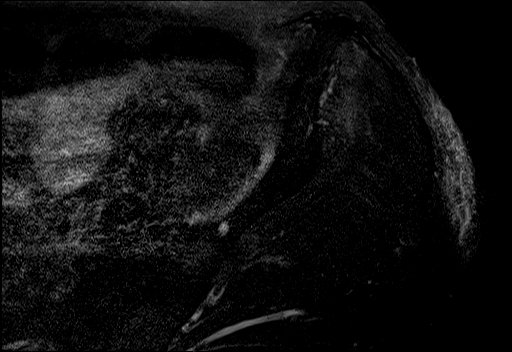

[33 of 40 positions shown; findings below may reference images not displayed]

FINDINGS: Bones: There is no evidence of acute fracture, dislocation or
avascular necrosis. No focal bone lesion. The visualized sacroiliac
joints and symphysis pubis appear unremarkable. Lower lumbar spine
degenerative changes.

Articular cartilage and labrum

Articular cartilage:  Mild chondrosis

Labrum: Degenerative anterior superior labral tearing.

Joint or bursal effusion

Joint effusion: No significant hip joint effusion.

Bursae: No evidence of trochanteric bursitis.

Muscles and tendons

Muscles and tendons: There is mild left subgluteus minimus bursal
distension. The gluteal tendons appear intact. The proximal
hamstrings are intact.The adductors demonstrate diffuse feathery
intramuscular edema. There is mild edema of the iliopsoas muscle and
within the paraspinal musculature, partially visualized. Fatty
atrophy of the gluteus minimus muscles bilaterally.

Other findings

Miscellaneous: The visualized internal pelvic contents appear
unremarkable.
IMPRESSION: Mild left hip osteoarthritis with degenerative anterior superior
labral tearing. No acute osseous abnormality.

Feathery intramuscular edema within the left adductor muscles, left
iliopsoas muscle, and partially visualized paraspinal musculature.
This could represent myositis or muscle strain.

Mild left-sided subgluteus minimus bursitis.

## 2023-10-08 DEATH — deceased
